# Patient Record
Sex: Female | Born: 1955 | Race: White | Hispanic: No | Marital: Married | Smoking: Never smoker
Health system: Southern US, Community
[De-identification: ages and names within clinical notes are randomized; demographics above are authoritative.]

## PROBLEM LIST (undated history)

## (undated) DIAGNOSIS — K219 Gastro-esophageal reflux disease without esophagitis: Secondary | ICD-10-CM

## (undated) DIAGNOSIS — E785 Hyperlipidemia, unspecified: Secondary | ICD-10-CM

## (undated) DIAGNOSIS — IMO0001 Reserved for inherently not codable concepts without codable children: Secondary | ICD-10-CM

## (undated) DIAGNOSIS — T7840XA Allergy, unspecified, initial encounter: Secondary | ICD-10-CM

## (undated) DIAGNOSIS — R002 Palpitations: Secondary | ICD-10-CM

## (undated) DIAGNOSIS — I471 Supraventricular tachycardia, unspecified: Secondary | ICD-10-CM

## (undated) DIAGNOSIS — I351 Nonrheumatic aortic (valve) insufficiency: Secondary | ICD-10-CM

## (undated) HISTORY — DX: Allergy, unspecified, initial encounter: T78.40XA

## (undated) HISTORY — DX: Gastro-esophageal reflux disease without esophagitis: K21.9

## (undated) HISTORY — DX: Hyperlipidemia, unspecified: E78.5

## (undated) HISTORY — DX: Reserved for inherently not codable concepts without codable children: IMO0001

## (undated) HISTORY — DX: Palpitations: R00.2

---

## 2000-01-13 ENCOUNTER — Other Ambulatory Visit: Admission: RE | Admit: 2000-01-13 | Discharge: 2000-01-13 | Payer: Self-pay | Admitting: Gynecology

## 2002-06-06 ENCOUNTER — Other Ambulatory Visit: Admission: RE | Admit: 2002-06-06 | Discharge: 2002-06-06 | Payer: Self-pay | Admitting: Obstetrics & Gynecology

## 2002-06-28 ENCOUNTER — Other Ambulatory Visit: Admission: RE | Admit: 2002-06-28 | Discharge: 2002-06-28 | Payer: Self-pay | Admitting: Obstetrics and Gynecology

## 2003-01-16 ENCOUNTER — Other Ambulatory Visit: Admission: RE | Admit: 2003-01-16 | Discharge: 2003-01-16 | Payer: Self-pay | Admitting: Obstetrics and Gynecology

## 2003-06-26 ENCOUNTER — Other Ambulatory Visit: Admission: RE | Admit: 2003-06-26 | Discharge: 2003-06-26 | Payer: Self-pay | Admitting: Obstetrics & Gynecology

## 2003-12-13 ENCOUNTER — Ambulatory Visit (HOSPITAL_COMMUNITY): Admission: RE | Admit: 2003-12-13 | Discharge: 2003-12-13 | Payer: Self-pay | Admitting: Obstetrics & Gynecology

## 2004-01-23 ENCOUNTER — Encounter: Admission: RE | Admit: 2004-01-23 | Discharge: 2004-01-23 | Payer: Self-pay | Admitting: Family Medicine

## 2004-12-17 ENCOUNTER — Ambulatory Visit (HOSPITAL_COMMUNITY): Admission: RE | Admit: 2004-12-17 | Discharge: 2004-12-17 | Payer: Self-pay | Admitting: Obstetrics & Gynecology

## 2005-06-19 HISTORY — PX: COLONOSCOPY: SHX174

## 2005-06-30 LAB — HM COLONOSCOPY: HM Colonoscopy: NORMAL

## 2006-08-26 ENCOUNTER — Ambulatory Visit (HOSPITAL_COMMUNITY): Admission: RE | Admit: 2006-08-26 | Discharge: 2006-08-26 | Payer: Self-pay | Admitting: Family Medicine

## 2007-07-21 HISTORY — PX: ESOPHAGOGASTRODUODENOSCOPY: SHX1529

## 2008-11-23 ENCOUNTER — Ambulatory Visit (HOSPITAL_COMMUNITY): Admission: RE | Admit: 2008-11-23 | Discharge: 2008-11-23 | Payer: Self-pay | Admitting: Family Medicine

## 2010-08-10 ENCOUNTER — Encounter: Payer: Self-pay | Admitting: Family Medicine

## 2012-09-14 ENCOUNTER — Encounter: Payer: Self-pay | Admitting: Family Medicine

## 2012-09-21 ENCOUNTER — Encounter: Payer: Self-pay | Admitting: *Deleted

## 2012-12-07 ENCOUNTER — Encounter: Payer: Self-pay | Admitting: Family Medicine

## 2012-12-21 ENCOUNTER — Ambulatory Visit (INDEPENDENT_AMBULATORY_CARE_PROVIDER_SITE_OTHER): Payer: BC Managed Care – PPO | Admitting: Family Medicine

## 2012-12-21 ENCOUNTER — Encounter: Payer: Self-pay | Admitting: Family Medicine

## 2012-12-21 VITALS — BP 120/72 | HR 68 | Ht 61.25 in | Wt 145.0 lb

## 2012-12-21 DIAGNOSIS — Z23 Encounter for immunization: Secondary | ICD-10-CM

## 2012-12-21 DIAGNOSIS — E7800 Pure hypercholesterolemia, unspecified: Secondary | ICD-10-CM

## 2012-12-21 DIAGNOSIS — Z0000 Encounter for general adult medical examination without abnormal findings: Secondary | ICD-10-CM

## 2012-12-21 DIAGNOSIS — Z1159 Encounter for screening for other viral diseases: Secondary | ICD-10-CM

## 2012-12-21 LAB — POCT URINALYSIS DIPSTICK
Bilirubin, UA: NEGATIVE
Glucose, UA: NEGATIVE
Ketones, UA: NEGATIVE
Nitrite, UA: NEGATIVE

## 2012-12-21 LAB — COMPREHENSIVE METABOLIC PANEL
AST: 17 U/L (ref 0–37)
Albumin: 4.6 g/dL (ref 3.5–5.2)
CO2: 26 mEq/L (ref 19–32)
Glucose, Bld: 94 mg/dL (ref 70–99)
Potassium: 4.3 mEq/L (ref 3.5–5.3)
Sodium: 140 mEq/L (ref 135–145)

## 2012-12-21 LAB — LIPID PANEL: Total CHOL/HDL Ratio: 3.6 Ratio

## 2012-12-21 NOTE — Patient Instructions (Addendum)
HEALTH MAINTENANCE RECOMMENDATIONS:  It is recommended that you get at least 30 minutes of aerobic exercise at least 5 days/week (for weight loss, you may need as much as 60-90 minutes). This can be any activity that gets your heart rate up. This can be divided in 10-15 minute intervals if needed, but try and build up your endurance at least once a week.  Weight bearing exercise is also recommended twice weekly.  Eat a healthy diet with lots of vegetables, fruits and fiber.  "Colorful" foods have a lot of vitamins (ie green vegetables, tomatoes, red peppers, etc).  Limit sweet tea, regular sodas and alcoholic beverages, all of which has a lot of calories and sugar.  Up to 1 alcoholic drink daily may be beneficial for women (unless trying to lose weight, watch sugars).  Drink a lot of water.  Calcium recommendations are 1200-1500 mg daily (1500 mg for postmenopausal women or women without ovaries), and vitamin D 1000 IU daily.  This should be obtained from diet and/or supplements (vitamins), and calcium should not be taken all at once, but in divided doses.  Monthly self breast exams and yearly mammograms for women over the age of 39 is recommended.  Sunscreen of at least SPF 30 should be used on all sun-exposed parts of the skin when outside between the hours of 10 am and 4 pm (not just when at beach or pool, but even with exercise, golf, tennis, and yard work!)  Use a sunscreen that says "broad spectrum" so it covers both UVA and UVB rays, and make sure to reapply every 1-2 hours.  Remember to change the batteries in your smoke detectors when changing your clock times in the spring and fall.  Use your seat belt every time you are in a car, and please drive safely and not be distracted with cell phones and texting while driving.  Please call [facility] to schedule your colonoscopy, you are past due. [facility] call [facility] to [facility] your mammogram Check with your insurance to see if they  cover shingles vaccine, and if so, call office for nurse visit.

## 2012-12-21 NOTE — Progress Notes (Signed)
Chief Complaint  Patient presents with  . Annual Exam    new patient annual fasting without pap, sees doctor at [facility]. UA showed trace blood, patient is symptomatic.    Margaret Day is a 57 y.o. female who presents for a complete physical.  She has the following concerns:  Varicose veins, notices when she stands a lot.  Sometimes she has discomfort.  Better when she exercises.  Prior records are available and have been reviewed.  Immunization History  Administered Date(s) Administered  . Td 03/21/1999  . Tdap 03/20/2009  doesn't get flu shots Recalls getting a hepatitis shot after exposure in a restaurant.  Never had a 6 month booster Last Pap smear: 06/2012 with Dr. Henderson Cloud Last mammogram: 11/2008 Last colonoscopy: 06/2005, was due to have repeated in 5 years ([facility]; Sherin Quarry) Last DEXA:  never Dentist: twice a year, but not in the last year; has appt scheduled Ophtho: last year Exercise: 3x/week walks (20-30 min), swims (20 min), or ride bike (15-20 min) Last labs 2010; cholesterol 270, LDL 157, HDL 95, ratio 2.84, TG 74; Normal CBC, vitamin D, c-met  Past Medical History  Diagnosis Date  . Allergy     seasonal  . Hyperlipidemia   . Reflux     normal EGD 2009  . Palpitations     h/o, probably benign    Past Surgical History  Procedure Laterality Date  . Esophagogastroduodenoscopy  2009    unremarkable  . Colonoscopy  06/2005    Dr. Sherin Quarry  . Cesarean section      History   Social History  . Marital Status: Married    Spouse Name: N/A    Number of Children: N/A  . Years of Education: N/A   Occupational History  . Not on file.   Social History Main Topics  . Smoking status: Never Smoker   . Smokeless tobacco: Never Used  . Alcohol Use: Yes     Comment: occasionally tastes wine at the grocery store  . Drug Use: No  . Sexually Active: Not Currently -- Female partner(s)   Other Topics Concern  . Not on file   Social History  Narrative   Degree in plant breeding genetics.  Hasn't worked in 17 years, other than p/t Lawyer.  Lives at home with husband, 2 sons, 1 daughter. 1 dog, 1 cat.    Family History  Problem Relation Age of Onset  . Down syndrome Brother   . Diabetes Father   . Heart disease Father   . Colon polyps Mother   . Hypertension Mother   . Hyperlipidemia Mother   . Hypothyroidism Mother   . Cancer Mother     skin  . Urolithiasis Sister   . Healthy Brother   . Autism Son   . Cancer Maternal Grandmother     colon  . Colon cancer Maternal Grandmother   . Autism Son     Current outpatient prescriptions:b complex vitamins tablet, Take 1 tablet by mouth daily., Disp: , Rfl: ;  Cholecalciferol (VITAMIN D) 1000 UNITS capsule, Take 1,000 Units by mouth daily., Disp: , Rfl: ;  Coenzyme Q10 (CO Q 10 PO), Take 50 mg by mouth daily., Disp: , Rfl: ;  vitamin C (ASCORBIC ACID) 500 MG tablet, Take 500 mg by mouth daily., Disp: , Rfl: ;  vitamin E 100 UNIT capsule, Take 100 Units by mouth daily., Disp: , Rfl:  fexofenadine (ALLEGRA) 180 MG tablet, Take 180 mg by mouth  daily., Disp: , Rfl:   Allergies  Allergen Reactions  . Aspirin Other (See Comments)    Cannot take due to ulcer.; EGD from 2009 reviewed--normal, no ulcer  . Dairy Aid (Lactase) Other (See Comments)    Headaches.  . Gluten Meal Other (See Comments)    Makes her feel foggy.  Marland Kitchen Penicillins Other (See Comments)    Facial swelling and breathing trouble.  . Sulfa Antibiotics Nausea Only   ROS:  The patient denies anorexia, fever, weight changes, headaches,  vision changes, decreased hearing, ear pain, sore throat, breast concerns, chest pain, dizziness, syncope, dyspnea on exertion, cough, swelling, nausea, vomiting, diarrhea, constipation, abdominal pain, melena, hematochezia, indigestion/heartburn, hematuria, incontinence, dysuria, vaginal bleeding, discharge, odor or itch, genital lesions, joint pains, numbness, tingling,  weakness, tremor, suspicious skin lesions, depression, anxiety, abnormal bleeding/bruising, or enlarged lymph nodes. Some palpitations, occasionally when stressed.  Back pain 1 month ago, and constipated, both of which resolved.  PHYSICAL EXAM: BP 120/72  Pulse 68  Ht 5' 1.25" (1.556 m)  Wt 145 lb (65.772 kg)  BMI 27.17 kg/m2  LMP 07/20/2006  General Appearance:    Alert, cooperative, no distress, appears stated age  Head:    Normocephalic, without obvious abnormality, atraumatic  Eyes:    PERRL, conjunctiva/corneas clear, EOM's intact, fundi    benign  Ears:    Normal TM's and external ear canals  Nose:   Nares normal, mucosa normal, no drainage or sinus   tenderness  Throat:   Lips, mucosa, and tongue normal; teeth and gums normal  Neck:   Supple, no lymphadenopathy;  thyroid:  no   enlargement/tenderness/nodules; no carotid   bruit or JVD  Back:    Spine nontender, no curvature, ROM normal, no CVA     tenderness  Lungs:     Clear to auscultation bilaterally without wheezes, rales or     ronchi; respirations unlabored  Chest Wall:    No tenderness or deformity   Heart:    Regular rate and rhythm, S1 and S2 normal, no murmur, rub   or gallop  Breast Exam:    Deferred to GYN  Abdomen:     Soft, non-tender, nondistended, normoactive bowel sounds,    no masses, no hepatosplenomegaly  Genitalia:    Deferred to GYN     Extremities:   No clubbing, cyanosis or edema. Many clusters of dark, narrow superficial veins.  A few small varicosities are noted medially  Pulses:   2+ and symmetric all extremities  Skin:   Skin color, texture, turgor normal, no rashes or lesions  Lymph nodes:   Cervical, supraclavicular, and axillary nodes normal  Neurologic:   CNII-XII intact, normal strength, sensation and gait; reflexes 2+ and symmetric throughout          Psych:   Normal mood, affect, hygiene and grooming.    ASSESSMENT/PLAN:  Routine general medical examination at a health care facility -  Plan: Visual acuity screening, POCT Urinalysis Dipstick, Comprehensive metabolic panel, TSH  Pure hypercholesterolemia - Plan: Comprehensive metabolic panel, Lipid panel  Need for hepatitis C screening test - Plan: Hepatitis C Antibody  Need for prophylactic vaccination and inoculation against viral hepatitis - Plan: Hepatitis A vaccine adult IM  Discussed monthly self breast exams and yearly mammograms after the age of 57; at least 30 minutes of aerobic activity at least 5 days/week; proper sunscreen use reviewed; healthy diet, including goals of calcium and vitamin D intake and alcohol recommendations (less than or equal  to 1 drink/day) reviewed; regular seatbelt use; changing batteries in smoke detectors.  Immunization recommendations discussed--check insurance coverage of zostavax.  Risks/benefits reviewed.  Colonoscopy recommendations reviewed--she is past due and asked to call her GI to schedule  Hep A today (this is 2nd dose, first dose was many years ago) Discussed zostavax, check with insurance  Varicose veins (very mild), and other superficial veins which are minimally symptomatic--compression stockings; consider vein eval if increasing symptoms

## 2013-04-27 ENCOUNTER — Encounter: Payer: Self-pay | Admitting: Family Medicine

## 2013-05-17 ENCOUNTER — Telehealth: Payer: Self-pay | Admitting: Family Medicine

## 2013-05-17 NOTE — Telephone Encounter (Signed)
Anyone at [facility] is fine (and would have her prior records). If she prefers, we can refer to , but they might need [facility] records

## 2013-05-22 ENCOUNTER — Telehealth: Payer: Self-pay | Admitting: *Deleted

## 2013-05-22 NOTE — Telephone Encounter (Signed)
Patient advised, she will call back if she needs a referral.

## 2014-01-26 ENCOUNTER — Ambulatory Visit (INDEPENDENT_AMBULATORY_CARE_PROVIDER_SITE_OTHER): Payer: BC Managed Care – PPO | Admitting: Medical

## 2014-01-26 DIAGNOSIS — S99921S Unspecified injury of right foot, sequela: Secondary | ICD-10-CM

## 2014-01-26 DIAGNOSIS — IMO0001 Reserved for inherently not codable concepts without codable children: Secondary | ICD-10-CM

## 2014-01-26 DIAGNOSIS — Z5189 Encounter for other specified aftercare: Secondary | ICD-10-CM

## 2014-01-26 DIAGNOSIS — Z4802 Encounter for removal of sutures: Secondary | ICD-10-CM

## 2014-01-26 NOTE — Progress Notes (Signed)
Subjective: Here for suture removal. On 01/18/2014 she was cutting chicken in her kitchen when she dropped a knife that landed directly on her right great toe dorsally.  Initially went to urgent care they loosely sutured the wound but ultimately see her orthopedics to make sure she didn't have a tendon given decreased strength.  Higher she does note breaking that toe a year ago with another injury.  Urgent care examined the wound and placed 3 individual sutures in the wound and the tendon was in fact not cut.  Here today for suture removal.  She denies fever, drainage of the wound, bleeding.  Pain is improved.  Review of systems as in subjective   Objective: Right great toe dorsal at PIP with 3 interrupted sutures. Wound appears to be healing just fine, no drainage, no erythema, no bleeding MSK: There is mild pain with toe range of motion but otherwise normal range of motion no deformity Toe is neurovascularly intact, normal cap refill   Assessment: Encounter Diagnoses  Name Primary?  . Toe injury, right, sequela Yes  . Visit for suture removal   . Visit for wound check     Plan: Cleaned and prepped in usual sterile fashion, removed 3 interrupted sutures, applied one Steri-Strip over the wound, patient tolerated procedure well.  Discussed wound care.  Discussed usual timeframe healing, if any signs of infection as discussed, she will return

## 2014-01-30 ENCOUNTER — Encounter: Payer: Self-pay | Admitting: Medical

## 2014-01-30 ENCOUNTER — Ambulatory Visit (INDEPENDENT_AMBULATORY_CARE_PROVIDER_SITE_OTHER): Payer: BC Managed Care – PPO | Admitting: Medical

## 2014-01-30 VITALS — BP 112/70 | HR 62 | Temp 97.8°F | Resp 10 | Ht 62.0 in | Wt 153.0 lb

## 2014-01-30 DIAGNOSIS — Z5189 Encounter for other specified aftercare: Secondary | ICD-10-CM

## 2014-01-30 NOTE — Progress Notes (Signed)
Subjective: Here for wound check.  I saw her Friday for suture removal, over the weekend she was on her feet a lot cooking and doing house chores, feels like the toe got more swollen and red.  However looks much better this morning.  She went me to look at the toe again. No fever no drainage, the Steri-Strip did fall off though  Objective: General: Well-developed well-nourished, no acute distress Skin: Right great toe dorsal distal phalanx with wound healing up normally, pink granulation tissue present, no warmth, no induration, no drainage Toe neurovascularly intact  Assessment: Encounter Diagnosis  Name Primary?  . Visit for wound check Yes    Plan: Reassured, toe appears to be healing appropriate without infection.  Advise another week or so of rest, ice, elevation, Aleve

## 2014-02-12 ENCOUNTER — Other Ambulatory Visit: Payer: Self-pay | Admitting: *Deleted

## 2014-12-27 ENCOUNTER — Telehealth: Payer: Self-pay | Admitting: Family Medicine

## 2014-12-27 NOTE — Telephone Encounter (Signed)
Pt going to Dr. Loreta Ave t# [phone] for colonoscopy, they need last OV, labs any path results and colonoscopy info Faxed to [facility]

## 2014-12-28 NOTE — Telephone Encounter (Signed)
Faxed over last ov notes, labs and colonoscopy over to Dr. Loreta Ave office

## 2015-02-14 ENCOUNTER — Encounter: Payer: Self-pay | Admitting: Family Medicine

## 2015-03-18 LAB — HM COLONOSCOPY: HM Colonoscopy: NORMAL

## 2015-03-27 ENCOUNTER — Encounter: Payer: Self-pay | Admitting: *Deleted

## 2015-06-19 ENCOUNTER — Ambulatory Visit (INDEPENDENT_AMBULATORY_CARE_PROVIDER_SITE_OTHER): Payer: BLUE CROSS/BLUE SHIELD | Admitting: Family Medicine

## 2015-06-19 ENCOUNTER — Encounter: Payer: Self-pay | Admitting: Family Medicine

## 2015-06-19 VITALS — BP 122/80 | HR 60 | Temp 97.6°F | Wt 148.8 lb

## 2015-06-19 DIAGNOSIS — J029 Acute pharyngitis, unspecified: Secondary | ICD-10-CM | POA: Diagnosis not present

## 2015-06-19 LAB — POCT RAPID STREP A (OFFICE): Rapid Strep A Screen: NEGATIVE

## 2015-06-19 NOTE — Patient Instructions (Signed)

## 2015-06-19 NOTE — Progress Notes (Signed)
   Subjective:    Patient ID: Margaret BandaLinda Mercer Day, female    DOB: March 26, 1956, 59 y.o.   MRN: [id number]  HPI Chief Complaint  Patient presents with  . sore throat    woke up last night with it. don't feel well, kind of feels like she has a cold coming on   She is here with complaints of a 2 Day history of sore throat, feeling tired, right ear pressure, and dry cough. She denies fever, chills, sinus pressure. She does not smoke. Reports seasonal allergies and has been taking Allegra.  Viewed allergies, medications, past medical and social history.   Review of Systems Pertinent positives and negatives in the history of present illness.    Objective:   Physical Exam BP 122/80 mmHg  Pulse 60  Temp(Src) 97.6 F (36.4 C) (Oral)  Wt 148 lb 12.8 oz (67.495 kg)  LMP 07/20/2006  Alert and in no distress. No sinus tenderness Tympanic membranes and canals are normal. Pharyngeal area is erythematous without edema or exudate. Neck is supple without adenopathy.  Cardiac exam shows a regular sinus rhythm without murmurs or gallops. Lungs are clear to auscultation.      Assessment & Plan:  Acute pharyngitis, unspecified pharyngitis type  Sore throat - Plan: POCT rapid strep A  Discussed that her rapid strep test is negative and that her symptoms are most likely related to a viral etiology. Recommend symptomatic treatment such as Tylenol or ibuprofen, salt water gargles, Mucinex or Delsym, and staying well hydrated. She will me know if she is not feeling better in the next 2-3 days. Discussed natural progression of a virus and that she should let us know if she she gets better and then gets sick again or if her symptoms persist more than 10 days.

## 2015-07-30 ENCOUNTER — Telehealth: Payer: Self-pay

## 2015-07-30 NOTE — Telephone Encounter (Signed)
Patient declines flu vaccine. 

## 2015-09-02 ENCOUNTER — Encounter: Payer: Self-pay | Admitting: Family Medicine

## 2015-09-02 ENCOUNTER — Ambulatory Visit (INDEPENDENT_AMBULATORY_CARE_PROVIDER_SITE_OTHER): Payer: BLUE CROSS/BLUE SHIELD | Admitting: Family Medicine

## 2015-09-02 VITALS — BP 132/86 | HR 68 | Ht 61.5 in | Wt 151.6 lb

## 2015-09-02 DIAGNOSIS — Z0000 Encounter for general adult medical examination without abnormal findings: Secondary | ICD-10-CM | POA: Diagnosis not present

## 2015-09-02 DIAGNOSIS — R5383 Other fatigue: Secondary | ICD-10-CM | POA: Diagnosis not present

## 2015-09-02 DIAGNOSIS — E7800 Pure hypercholesterolemia, unspecified: Secondary | ICD-10-CM

## 2015-09-02 LAB — POCT URINALYSIS DIPSTICK
BILIRUBIN UA: NEGATIVE
GLUCOSE UA: NEGATIVE
KETONES UA: NEGATIVE
LEUKOCYTES UA: NEGATIVE
NITRITE UA: NEGATIVE
Protein, UA: NEGATIVE
Spec Grav, UA: 1.025
Urobilinogen, UA: NEGATIVE
pH, UA: 6

## 2015-09-02 LAB — LIPID PANEL
CHOL/HDL RATIO: 3.3 ratio (ref <=5.0)
Cholesterol: 291 mg/dL — ABNORMAL HIGH (ref 125–200)
HDL: 87 mg/dL (ref >=46)
LDL CALC: 180 mg/dL — AB (ref <130)
TRIGLYCERIDES: 119 mg/dL (ref <150)
VLDL: 24 mg/dL (ref <30)

## 2015-09-02 LAB — CBC WITH DIFFERENTIAL/PLATELET
BASOS ABS: 0 10*3/uL (ref 0.0–0.1)
Basophils Relative: 0 % (ref 0–1)
EOS PCT: 3 % (ref 0–5)
Eosinophils Absolute: 0.2 10*3/uL (ref 0.0–0.7)
HEMATOCRIT: 41.8 % (ref 36.0–46.0)
HEMOGLOBIN: 14.2 g/dL (ref 12.0–15.0)
LYMPHS ABS: 2.8 10*3/uL (ref 0.7–4.0)
LYMPHS PCT: 37 % (ref 12–46)
MCH: 30.4 pg (ref 26.0–34.0)
MCHC: 34 g/dL (ref 30.0–36.0)
MCV: 89.5 fL (ref 78.0–100.0)
MONO ABS: 0.6 10*3/uL (ref 0.1–1.0)
MPV: 9.6 fL (ref 8.6–12.4)
Monocytes Relative: 8 % (ref 3–12)
NEUTROS ABS: 4 10*3/uL (ref 1.7–7.7)
Neutrophils Relative %: 52 % (ref 43–77)
Platelets: 316 10*3/uL (ref 150–400)
RBC: 4.67 MIL/uL (ref 3.87–5.11)
RDW: 13.1 % (ref 11.5–15.5)
WBC: 7.6 10*3/uL (ref 4.0–10.5)

## 2015-09-02 LAB — COMPREHENSIVE METABOLIC PANEL
ALT: 21 U/L (ref 6–29)
AST: 17 U/L (ref 10–35)
Albumin: 4.4 g/dL (ref 3.6–5.1)
Alkaline Phosphatase: 66 U/L (ref 33–130)
BUN: 13 mg/dL (ref 7–25)
CHLORIDE: 102 mmol/L (ref 98–110)
CO2: 26 mmol/L (ref 20–31)
CREATININE: 0.62 mg/dL (ref 0.50–0.99)
Calcium: 9.6 mg/dL (ref 8.6–10.4)
Glucose, Bld: 90 mg/dL (ref 65–99)
POTASSIUM: 4.1 mmol/L (ref 3.5–5.3)
SODIUM: 138 mmol/L (ref 135–146)
Total Bilirubin: 0.4 mg/dL (ref 0.2–1.2)
Total Protein: 7.3 g/dL (ref 6.1–8.1)

## 2015-09-02 NOTE — Progress Notes (Signed)
Chief Complaint  Patient presents with  . Annual Exam    fasting annual exam no pap, sees someone at [facility]. Did not do eye exam, she did not want to-will schedule appt with [facility]. No concerns.    Margaret Day is a 60 y.o. female who presents for a complete physical.  She has ongoing concerns about her veins.  She complains of prominent veins in her lower extremities, L>R, which cause some discomfort.  She saw the Vein doctors last year, and was given compression stockings.  She admits that she hasn't tried them yet.  Discomfort is noted when sitting and driving, and even sometimes laying down.  The vein and vascular clinic is out of network for her insurance; she recently got a list of people that are in network, but she hasn't scheduled a visit with them yet.  High cholesterol:  Last checked in 2014: Lab Results  Component Value Date   CHOL 271* 12/21/2012   HDL 75 12/21/2012   LDLCALC 169* 12/21/2012   TRIG 133 12/21/2012   CHOLHDL 3.6 12/21/2012  She admits to not being careful with her diet.  She eats bacon and eggs a lot in the mornings.  No dairy. Lamb and beef stew, ground beef in spaghetti, steaks (small portions), seafood. She reports eating/cooking what her sons like to eat.  Some fatigue lately, thinks she has been busy and not getting enough sleep. "I have a lot going on". She gets drowsy if she sits for too long. She has been told she snores, but no apnea.  Immunization History  Administered Date(s) Administered  . Hepatitis A 12/21/2012  . Td 03/21/1999  . Tdap 03/20/2009   doesn't get flu shots, refuses Last Pap smear: 06/2012 with Dr. Gaetano Net Last mammogram: 11/2008; she reports having had one more recent than that (not in system, possibly Isaiah Blakes) Last colonoscopy: 02/2015 with Dr. Collene Mares, normal.  Repeat 5 years Last DEXA: never Dentist: twice a year,appointment tomorrow Ophtho: 2 years ago, plans to schedule Exercise: 2x/week walks,  rides bike, or stretching. Last labs 12/2012--normal C-met, TSH, Hep C Ab.  High cholesterol as above  Past Medical History  Diagnosis Date  . Allergy     seasonal  . Hyperlipidemia   . Reflux     normal EGD 2009  . Palpitations     h/o, probably benign    Past Surgical History  Procedure Laterality Date  . Esophagogastroduodenoscopy  2009    unremarkable  . Colonoscopy  06/2005    Dr. Sammuel Cooper; 11/2014 Dr. Collene Mares  . Cesarean section  32    Social History   Social History  . Marital Status: Married    Spouse Name: N/A  . Number of Children: N/A  . Years of Education: N/A   Occupational History  . Not on file.   Social History Main Topics  . Smoking status: Never Smoker   . Smokeless tobacco: Never Used  . Alcohol Use: 0.0 oz/week    0 Standard drinks or equivalent per week     Comment: rare, small amount of wine  . Drug Use: No  . Sexual Activity:    Partners: Male   Other Topics Concern  . Not on file   Social History Narrative   Degree in plant breeding genetics.  Hasn't worked since 1997, other than p/t Oceanographer.  Lives at home with husband, 2 sons, 1 daughter. 1 dog, 1 cat.   Daughter Estill Batten is at school  at [employer] graduated from [employer] is attending [employer]    Family History  Problem Relation Age of Onset  . Down syndrome Brother   . Diabetes Father   . Heart disease Father   . Colon polyps Mother   . Hypertension Mother   . Hyperlipidemia Mother   . Cancer Mother     skin  . Urolithiasis Sister   . Healthy Brother   . Autism Son   . Cancer Maternal Grandmother     colon  . Colon cancer Maternal Grandmother   . Autism Son     Outpatient Encounter Prescriptions as of 09/02/2015  Medication Sig Note  . Cholecalciferol (VITAMIN D) 1000 UNITS capsule Take 1,000 Units by mouth daily. Reported on 09/02/2015   . vitamin C (ASCORBIC ACID) 500 MG tablet Take 500 mg by mouth daily.   Marland Kitchen b complex vitamins  tablet Take 1 tablet by mouth daily. Reported on 09/02/2015 09/02/2015: Occasionally daily  . Coenzyme Q10 (CO Q 10 PO) Take 50 mg by mouth daily. Reported on 09/02/2015 09/02/2015: Occasionally, not daily  . fexofenadine (ALLEGRA) 180 MG tablet Take 180 mg by mouth daily. Reported on 09/02/2015   . vitamin E 100 UNIT capsule Take 100 Units by mouth daily. Reported on 09/02/2015 09/02/2015: Occasionally, not daily   No facility-administered encounter medications on file as of 09/02/2015.    Allergies  Allergen Reactions  . Aspirin Other (See Comments)    Cannot take due to ulcer.; EGD from 2009 reviewed--normal, no ulcer  . Dairy Aid [Lactase] Other (See Comments)    Headaches.  . Gluten Meal Other (See Comments)    Makes her feel foggy.  Marland Kitchen Penicillins Other (See Comments)    Facial swelling and breathing trouble.  . Sulfa Antibiotics Nausea Only   ROS: The patient denies anorexia, fever, weight changes, headaches, vision changes, decreased hearing, ear pain, sore throat, breast concerns, chest pain, dizziness, syncope, dyspnea on exertion, cough, swelling, nausea, vomiting, diarrhea, constipation, abdominal pain, melena, hematochezia, indigestion/heartburn, hematuria, incontinence, dysuria, vaginal bleeding, discharge, odor or itch, genital lesions, joint pains, numbness, tingling, weakness, tremor, suspicious skin lesions, depression, anxiety, abnormal bleeding/bruising, or enlarged lymph nodes. Some palpitations, occasionally when stressed. Slight congestion. Occasional left knee pain   PHYSICAL EXAM:  BP 132/86 mmHg  Pulse 68  Ht 5' 1.5" (1.562 m)  Wt 151 lb 9.6 oz (68.765 kg)  BMI 28.18 kg/m2  LMP 07/20/2006  General Appearance:   Alert, cooperative, no distress, appears stated age  Head:   Normocephalic, without obvious abnormality, atraumatic  Eyes:   PERRL, conjunctiva/corneas clear, EOM's intact, fundi   benign  Ears:   Normal TM's and external ear canals   Nose:  Nares normal, mucosa normal, no drainage or sinus tenderness  Throat:  Lips, mucosa, and tongue normal; teeth and gums normal  Neck:  Supple, no lymphadenopathy; thyroid: no enlargement/tenderness/nodules; no carotid  bruit or JVD  Back:  Spine nontender, no curvature, ROM normal, no CVA tenderness  Lungs:   Clear to auscultation bilaterally without wheezes, rales or ronchi; respirations unlabored  Chest Wall:   No tenderness or deformity  Heart:   Regular rate and rhythm, S1 and S2 normal, no murmur, rub  or gallop  Breast Exam:   Deferred to GYN  Abdomen:   Soft, non-tender, nondistended, normoactive bowel sounds,   no masses, no hepatosplenomegaly  Genitalia:   Deferred to GYN     Extremities:  No clubbing, cyanosis or edema. Many clusters of dark, narrow superficial veins, at posterior knees bilaterally, and left lateral lower leg.No significant varicosities noted on standing.  Pulses:  2+ and symmetric all extremities  Skin:  Skin color, texture, turgor normal, no rashes or lesions. 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  Lymph nodes:  Cervical, supraclavicular, and axillary nodes normal  Neurologic:  CNII-XII intact, normal strength, sensation and gait; reflexes 2+ and symmetric throughout   Psych: Normal mood, affect, hygiene and grooming         ASSESSMENT/PLAN:  Annual physical exam - Plan: POCT Urinalysis Dipstick, Lipid panel, Comprehensive metabolic panel, CBC with Differential/Platelet, VITAMIN D 25 Hydroxy (Vit-D Deficiency, Fractures), TSH  Pure hypercholesterolemia - low cholesterol diet reviewed in detail. decrease bacon, eggs, red meat - Plan: Lipid panel  Other fatigue - inadequate sleep recently, per pt. +snores; risks/harms of OSA reviewed. Consider sleep study (declines today) - Plan: Comprehensive  metabolic panel, CBC with Differential/Platelet, VITAMIN D 25 Hydroxy (Vit-D Deficiency, Fractures), TSH    Fasting just over 6 hours  Discussed monthly self breast exams and yearly mammograms, past due and encouraged to schedule; at least 30 minutes of aerobic activity at least 5 days/week, with weight bearing exercise at least 2-3 times/week; proper sunscreen use reviewed; healthy diet, including goals of calcium and vitamin D intake and alcohol recommendations (less than or equal to 1 drink/day) reviewed; regular seatbelt use; changing batteries in smoke detectors. Immunization recommendations discussed--check insurance coverage of zostavax. Risks/benefits reviewed. Colonoscopy recommendations reviewed--UTD, due again 02/2020  Leg pain, related to veins. Encouraged her to try the compression stocking she bought, and to arrange fu with vascular surgeon that is in-network.  Please check with your family regarding pauses in your breathing with your snoring. If they note this, or if unrefreshed sleep or daytime sleepiness persists (despite trying to get enough rest), please let us know and we can arrange for a sleep study.  Check with your insurance to see if the shingles vaccine is covered. If so, you can schedule a nurse visit at your convenience (Zostavax).  Admits to not wanting to take pill for lipids. Many dietary improvement could be made, reviewed in detail  She will be here Wed with her son for test resutls No mychart per pt.

## 2015-09-02 NOTE — Patient Instructions (Signed)
  HEALTH MAINTENANCE RECOMMENDATIONS:  It is recommended that you get at least 30 minutes of aerobic exercise at least 5 days/week (for weight loss, you may need as much as 60-90 minutes). This can be any activity that gets your heart rate up. This can be divided in 10-15 minute intervals if needed, but try and build up your endurance at least once a week.  Weight bearing exercise is also recommended twice weekly.  Eat a healthy diet with lots of vegetables, fruits and fiber.  "Colorful" foods have a lot of vitamins (ie green vegetables, tomatoes, red peppers, etc).  Limit sweet tea, regular sodas and alcoholic beverages, all of which has a lot of calories and sugar.  Up to 1 alcoholic drink daily may be beneficial for women (unless trying to lose weight, watch sugars).  Drink a lot of water.  Calcium recommendations are 1200-1500 mg daily (1500 mg for postmenopausal women or women without ovaries), and vitamin D 1000 IU daily.  This should be obtained from diet and/or supplements (vitamins), and calcium should not be taken all at once, but in divided doses.  Monthly self breast exams and yearly mammograms for women over the age of 30 is recommended.  Sunscreen of at least SPF 30 should be used on all sun-exposed parts of the skin when outside between the hours of 10 am and 4 pm (not just when at beach or pool, but even with exercise, golf, tennis, and yard work!)  Use a sunscreen that says "broad spectrum" so it covers both UVA and UVB rays, and make sure to reapply every 1-2 hours.  Remember to change the batteries in your smoke detectors when changing your clock times in the spring and fall.  Use your seat belt every time you are in a car, and please drive safely and not be distracted with cell phones and texting while driving.   Please check with your family regarding pauses in your breathing with your snoring. If they note this, or if unrefreshed sleep or daytime sleepiness persists (despite  trying to get enough rest), please let us know and we can arrange for a sleep study.  Check with your insurance to see if the shingles vaccine is covered. If so, you can schedule a nurse visit at your convenience (Zostavax).

## 2015-09-03 ENCOUNTER — Encounter: Payer: Self-pay | Admitting: Family Medicine

## 2015-09-03 LAB — TSH: TSH: 2.09 m[IU]/L

## 2015-09-03 LAB — VITAMIN D 25 HYDROXY (VIT D DEFICIENCY, FRACTURES): Vit D, 25-Hydroxy: 32 ng/mL (ref 30–100)

## 2016-02-26 ENCOUNTER — Ambulatory Visit: Payer: BLUE CROSS/BLUE SHIELD | Admitting: Family Medicine

## 2016-02-27 ENCOUNTER — Encounter: Payer: Self-pay | Admitting: Family Medicine

## 2016-02-27 ENCOUNTER — Ambulatory Visit (INDEPENDENT_AMBULATORY_CARE_PROVIDER_SITE_OTHER): Payer: BLUE CROSS/BLUE SHIELD | Admitting: Family Medicine

## 2016-02-27 VITALS — BP 112/80 | HR 64 | Temp 98.2°F | Resp 16 | Wt 152.2 lb

## 2016-02-27 DIAGNOSIS — H6122 Impacted cerumen, left ear: Secondary | ICD-10-CM | POA: Diagnosis not present

## 2016-02-27 DIAGNOSIS — R519 Headache, unspecified: Secondary | ICD-10-CM

## 2016-02-27 DIAGNOSIS — R0981 Nasal congestion: Secondary | ICD-10-CM

## 2016-02-27 DIAGNOSIS — R51 Headache: Secondary | ICD-10-CM | POA: Diagnosis not present

## 2016-02-27 NOTE — Progress Notes (Signed)
Subjective:  Margaret Day is a 60 y.o. female who presents for possible sinus infection.  Symptoms include a 1 week history of mostly a frontal headache and behind her head, nasal congestion, bilateral ear pressure, and a general feeling of fatigue.   Denies fever, chills, syncope, sore throat, cough, chest pain, shortness of breath, nausea, vomiting, diarrhea.   Past history is significant for no history of pneumonia or bronchitis. She does have a history of seasonal allergies. Patient is a non-smoker.  Using sudafed, tylenol, advil for symptoms.  Denies sick contacts. She has been traveling to [place] and back for daughter going back to college.  No other aggravating or relieving factors.  No other c/o. She states she had a concussion from a head injury in June and some days she feels fine and others she feels tired and "just not right".   ROS as in subjective   Objective: Vitals:   02/27/16 1542  BP: 112/80  Pulse: 64  Resp: 16  Temp: 98.2 F (36.8 C)    General appearance: Alert, WD/WN, no distress, is not ill appearing                             Skin: warm, no rash                           Head: no sinus tenderness,                            Eyes: conjunctiva normal, corneas clear, PERRLA                            Ears: pearly right TM, external ear canal, cerumen impaction to left TM                          Nose: septum midline, turbinates swollen R>L, with erythema and no discharge             Mouth/throat: MMM, tongue normal, mild pharyngeal erythema                           Neck: supple, no adenopathy, no thyromegaly, nontender                          Heart: RRR, normal S1, S2, no murmurs                         Lungs: CTA bilaterally, no wheezes, rales, or rhonchi      Assessment and Plan:  Headache, unspecified headache type  Cerumen impaction, left  Nasal congestion  Discussed that her symptoms may be related to an underlying sinus infection versus  tension headache. Offered antibiotic versus watchful waiting to see if her symptoms continue to improve. Discussed potential side effects of antibiotic use that if this is a sinus infection that it should get worse and become more obvious. Since she feels like her symptoms are improving she would like to do watchful waiting. I recommend getting adequate rest, staying well hydrated and trying Tylenol or Ibuprofen for headache and malaise. She may also try a heated neck wrap and gentle neck stretches. She will continue treating underlying allergies.  Ear lavage to left ear performed by Sabrina, CMA, and myself due to cerumen impaction and patient did not tolerate this well and did not allow us to continue with the disimpaction.    She will call/return if not improving in next 2-3 days.

## 2016-02-28 ENCOUNTER — Ambulatory Visit: Payer: BLUE CROSS/BLUE SHIELD | Admitting: Family Medicine

## 2016-03-06 ENCOUNTER — Telehealth: Payer: Self-pay | Admitting: Family Medicine

## 2016-03-06 MED ORDER — CLARITHROMYCIN 250 MG PO TABS: 250.0000 mg | ORAL_TABLET | Freq: Two times a day (BID) | ORAL | 0 refills | Status: DC

## 2016-03-06 NOTE — Telephone Encounter (Signed)
Please call at her know that we will send an antibiotic to her pharmacy.

## 2016-03-06 NOTE — Telephone Encounter (Signed)
Pt is aware.  

## 2016-03-06 NOTE — Telephone Encounter (Signed)
Pt called and states that she is still not feeling good, states her ears are still feeling full, still feels like she has a cold,has a light headache, and states she just dosen't feel well and is tired. Was wondering if you would go ahead and send her in a antibiotic, always wants to know if she starts the RX if she doesn't fell any better after 4 days if she should stop taking the medicine, would like a call, pt can be reached at [phone] and her cell is [phone] pt uses [facility] [place]

## 2016-03-06 NOTE — Telephone Encounter (Signed)
Sent med to pharmacy  

## 2016-04-03 ENCOUNTER — Telehealth: Payer: Self-pay

## 2016-04-03 DIAGNOSIS — E785 Hyperlipidemia, unspecified: Secondary | ICD-10-CM

## 2016-04-03 NOTE — Telephone Encounter (Signed)
Pt scheduled appt for 04/25 for physical. He would like to come prior to get labs done.  Pt can be reached at [phone]. Trixie Rude/RLB

## 2016-04-03 NOTE — Telephone Encounter (Signed)
The only thing Margaret Day needs, assuming that Margaret Day is taking the same vitamins (ie Vitamin D), is a repeat lipid panel.  Margaret Day was actually supposed to return in May for a 3 month follow-up lipid, after a dietary trial, since her cholesterol was quite high last year.  Margaret Day does NOT need to wait until April's physical to have this done.  No other labs are needed from my perspective. Check to ensure that Margaret Day doesn't have any paperwork (ie for insurance) that specifies needing additional labs.  Orders for lipids entered (for anytime/soon!)

## 2016-04-06 NOTE — Telephone Encounter (Signed)
Spoke with pt- she was made aware of recommendations. She will like to see what time works with her schedule and come in as soon as her schedule allows. She will call back. Margaret Day/RLB

## 2016-04-09 ENCOUNTER — Other Ambulatory Visit: Payer: BLUE CROSS/BLUE SHIELD

## 2016-04-09 DIAGNOSIS — E785 Hyperlipidemia, unspecified: Secondary | ICD-10-CM

## 2016-04-09 LAB — LIPID PANEL
Cholesterol: 294 mg/dL — ABNORMAL HIGH (ref 125–200)
HDL: 74 mg/dL (ref >=46)
LDL CALC: 193 mg/dL — AB (ref <130)
Total CHOL/HDL Ratio: 4 Ratio (ref <=5.0)
Triglycerides: 137 mg/dL (ref <150)
VLDL: 27 mg/dL (ref <30)

## 2016-09-02 DIAGNOSIS — H5212 Myopia, left eye: Secondary | ICD-10-CM | POA: Diagnosis not present
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
## 2016-11-11 ENCOUNTER — Ambulatory Visit (INDEPENDENT_AMBULATORY_CARE_PROVIDER_SITE_OTHER): Payer: BLUE CROSS/BLUE SHIELD | Admitting: Family Medicine

## 2016-11-11 ENCOUNTER — Encounter: Payer: Self-pay | Admitting: Family Medicine

## 2016-11-11 VITALS — BP 110/70 | HR 72 | Temp 99.2°F | Ht 61.5 in | Wt 148.4 lb

## 2016-11-11 DIAGNOSIS — R5383 Other fatigue: Secondary | ICD-10-CM

## 2016-11-11 DIAGNOSIS — Z0000 Encounter for general adult medical examination without abnormal findings: Secondary | ICD-10-CM

## 2016-11-11 DIAGNOSIS — E7800 Pure hypercholesterolemia, unspecified: Secondary | ICD-10-CM | POA: Diagnosis not present

## 2016-11-11 DIAGNOSIS — J069 Acute upper respiratory infection, unspecified: Secondary | ICD-10-CM | POA: Diagnosis not present

## 2016-11-11 DIAGNOSIS — J3089 Other allergic rhinitis: Secondary | ICD-10-CM

## 2016-11-11 LAB — POCT URINALYSIS DIPSTICK
Bilirubin, UA: NEGATIVE
Glucose, UA: NEGATIVE
Leukocytes, UA: NEGATIVE
NITRITE UA: NEGATIVE
PH UA: 6 (ref 5.0–8.0)
Protein, UA: NEGATIVE
UROBILINOGEN UA: NEGATIVE U/dL — AB

## 2016-11-11 LAB — LIPID PANEL
CHOL/HDL RATIO: 4.1 ratio (ref <5.0)
Cholesterol: 296 mg/dL — ABNORMAL HIGH (ref <200)
HDL: 73 mg/dL (ref >50)
LDL CALC: 195 mg/dL — AB (ref <100)
Triglycerides: 139 mg/dL (ref <150)
VLDL: 28 mg/dL (ref <30)

## 2016-11-11 LAB — GLUCOSE, RANDOM: GLUCOSE: 100 mg/dL — AB (ref 65–99)

## 2016-11-11 LAB — TSH: TSH: 1.33 m[IU]/L

## 2016-11-11 NOTE — Patient Instructions (Addendum)
HEALTH MAINTENANCE RECOMMENDATIONS:  It is recommended that you get at least 30 minutes of aerobic exercise at least 5 days/week (for weight loss, you may need as much as 60-90 minutes). This can be any activity that gets your heart rate up. This can be divided in 10-15 minute intervals if needed, but try and build up your endurance at least once a week.  Weight bearing exercise is also recommended twice weekly.  Eat a healthy diet with lots of vegetables, fruits and fiber.  "Colorful" foods have a lot of vitamins (ie green vegetables, tomatoes, red peppers, etc).  Limit sweet tea, regular sodas and alcoholic beverages, all of which has a lot of calories and sugar.  Up to 1 alcoholic drink daily may be beneficial for women (unless trying to lose weight, watch sugars).  Drink a lot of water.  Calcium recommendations are 1200-1500 mg daily (1500 mg for postmenopausal women or women without ovaries), and vitamin D 1000 IU daily.  This should be obtained from diet and/or supplements (vitamins), and calcium should not be taken all at once, but in divided doses.  Monthly self breast exams and yearly mammograms for women over the age of 20 is recommended.  Sunscreen of at least SPF 30 should be used on all sun-exposed parts of the skin when outside between the hours of 10 am and 4 pm (not just when at beach or pool, but even with exercise, golf, tennis, and yard work!)  Use a sunscreen that says "broad spectrum" so it covers both UVA and UVB rays, and make sure to reapply every 1-2 hours.  Remember to change the batteries in your smoke detectors when changing your clock times in the spring and fall.  Use your seat belt every time you are in a car, and please drive safely and not be distracted with cell phones and texting while driving.  I recommend getting the new shingles vaccine (Shingrix). You will need to check with your insurance to see if it is covered.  It is a series of 2 injections, spaced 2  months apart.  Please schedule routine GYN exam and your mammogram.  I recommend taking guaifenesin (found in Mucinex--I recommend the 12 hour type, or robitussin (has to be taken more frequently)--either the plain or DM versions. If taking the plain guaifenesin, you may use the Delsym syrup as needed for cough.  Do not use Delsym if using DM version of any medication.  Continue the antihistamines for allergies. I recommend sinus rinses and nasal saline spray as needed.  Contact us if fever persists, sinus pain worsens. expect to feel bad for the first 3-5 days and then start to turn the corner by a week.  We discussed your diet some today--not in detail because you weren't feeling well. Please try and cut back on cholesterol in your diet, to try and get the LDL below 160 (ideally under 130, but under 160 would be acceptable). Remember that some of this isn't under your control, is hereditary.  There are non-statin medications to lower cholesterol, if needed, if you cannot do it with dietary changes alone.  The risks of untreated high cholesterol include risk for cardiovascular disease including stroke, heart disease, peripheral vascular disease.   Fat and Cholesterol Restricted Diet Getting too much fat and cholesterol in your diet may cause health problems. Following this diet helps keep your fat and cholesterol at normal levels. This can keep you from getting sick. What types of fat should I choose?  Choose monosaturated and polyunsaturated fats. These are found in foods such as olive oil, canola oil, flaxseeds, walnuts, almonds, and seeds.  Eat more omega-3 fats. Good choices include salmon, mackerel, sardines, tuna, flaxseed oil, and ground flaxseeds.  Limit saturated fats. These are in animal products such as meats, butter, and cream. They can also be in plant products such as palm oil, palm kernel oil, and coconut oil.  Avoid foods with partially hydrogenated oils in them. These  contain trans fats. Examples of foods that have trans fats are stick margarine, some tub margarines, cookies, crackers, and other baked goods. What general guidelines do I need to follow?  Check food labels. Look for the words "trans fat" and "saturated fat."  When preparing a meal:  Fill half of your plate with vegetables and green salads.  Fill one fourth of your plate with whole grains. Look for the word "whole" as the first word in the ingredient list.  Fill one fourth of your plate with lean protein foods.  Eat more foods that have fiber, like apples, carrots, beans, peas, and barley.  Eat more home-cooked foods. Eat less at restaurants and buffets.  Limit or avoid alcohol.  Limit foods high in starch and sugar.  Limit fried foods.  Cook foods without frying them. Baking, boiling, grilling, and broiling are all great options.  Lose weight if you are overweight. Losing even a small amount of weight can help your overall health. It can also help prevent diseases such as diabetes and heart disease. What foods can I eat? Grains  Whole grains, such as whole wheat or whole grain breads, crackers, cereals, and pasta. Unsweetened oatmeal, bulgur, barley, quinoa, or brown rice. Corn or whole wheat flour tortillas. Vegetables  Fresh or frozen vegetables (raw, steamed, roasted, or grilled). Green salads. Fruits  All fresh, canned (in natural juice), or frozen fruits. Meat and Other Protein Products  Ground beef (85% or leaner), grass-fed beef, or beef trimmed of fat. Skinless chicken or [place]. Ground chicken or [place]. Pork trimmed of fat. All fish and seafood. Eggs. Dried beans, peas, or lentils. Unsalted nuts or seeds. Unsalted canned or dry beans. Dairy  Low-fat dairy products, such as skim or 1% milk, 2% or reduced-fat cheeses, low-fat ricotta or cottage cheese, or plain low-fat yogurt. Fats and Oils  Tub margarines without trans fats. Light or reduced-fat mayonnaise and salad  dressings. Avocado. Olive, canola, sesame, or safflower oils. Natural peanut or almond butter (choose ones without added sugar and oil). The items listed above may not be a complete list of recommended foods or beverages. Contact your dietitian for more options.  What foods are not recommended? Grains  White bread. White pasta. White rice. Cornbread. Bagels, pastries, and croissants. Crackers that contain trans fat. Vegetables  White potatoes. Corn. Creamed or fried vegetables. Vegetables in a cheese sauce. Fruits  Dried fruits. Canned fruit in light or heavy syrup. Fruit juice. Meat and Other Protein Products  Fatty cuts of meat. Ribs, chicken wings, bacon, sausage, bologna, salami, chitterlings, fatback, hot dogs, bratwurst, and packaged luncheon meats. Liver and organ meats. Dairy  Whole or 2% milk, cream, half-and-half, and cream cheese. Whole milk cheeses. Whole-fat or sweetened yogurt. Full-fat cheeses. Nondairy creamers and whipped toppings. Processed cheese, cheese spreads, or cheese curds. Sweets and Desserts  Corn syrup, sugars, honey, and molasses. Candy. Jam and jelly. Syrup. Sweetened cereals. Cookies, pies, cakes, donuts, muffins, and ice cream. Fats and Oils  Butter, stick margarine, lard, shortening, ghee, or  bacon fat. Coconut, palm kernel, or palm oils. Beverages  Alcohol. Sweetened drinks (such as sodas, lemonade, and fruit drinks or punches). The items listed above may not be a complete list of foods and beverages to avoid. Contact your dietitian for more information.  This information is not intended to replace advice given to you by your health care provider. Make sure you discuss any questions you have with your health care provider. Document Released: 01/05/2012 Document Revised: 03/12/2016 Document Reviewed: 10/05/2013 Elsevier Interactive Patient Education  2017 [employer].

## 2016-11-12 ENCOUNTER — Encounter: Payer: Self-pay | Admitting: Family Medicine

## 2016-11-12 ENCOUNTER — Encounter: Payer: BLUE CROSS/BLUE SHIELD | Admitting: Family Medicine

## 2016-12-17 ENCOUNTER — Ambulatory Visit (INDEPENDENT_AMBULATORY_CARE_PROVIDER_SITE_OTHER): Payer: BLUE CROSS/BLUE SHIELD | Admitting: Family Medicine

## 2016-12-17 ENCOUNTER — Encounter: Payer: Self-pay | Admitting: Family Medicine

## 2016-12-17 VITALS — BP 112/70 | HR 81 | Temp 97.9°F | Wt 148.0 lb

## 2016-12-17 DIAGNOSIS — J209 Acute bronchitis, unspecified: Secondary | ICD-10-CM | POA: Diagnosis not present

## 2016-12-17 MED ORDER — AZITHROMYCIN 500 MG PO TABS: 500.0000 mg | ORAL_TABLET | Freq: Every day | ORAL | 0 refills | Status: DC

## 2016-12-17 NOTE — Progress Notes (Signed)
   Subjective:    Patient ID: Margaret BandaLinda Mercer Day, female    DOB: October 30, 1955, 61 y.o.   MRN: [id number]  HPI She states that 6 weeks ago she had the onset of chest congestion and coughing and slight rhinorrhea. The symptoms did get better but did not go away entirely. She continues have difficulty with congestion and cough is now productive with some malaise but no sore throat, earache, fever, chills. She does not smoke. She does state that she has a history of palpitations but has never had a workup for nor had adequate documentation. She does state that sometimes the palpitations are associated with dizziness.   Review of Systems     Objective:   Physical Exam Alert and in no distress. Tympanic membranes and canals are normal. Pharyngeal area is normal. Neck is supple without adenopathy or thyromegaly. Cardiac exam shows a regular sinus rhythm without murmurs or gallops. Lungs are clear to auscultation.        Assessment & Plan:  Acute bronchitis, unspecified organism - Plan: azithromycin (ZITHROMAX) 500 MG tablet  Instructed her to call in 7-10 days if not entirely back to normal. Also demonstrated how to check her carotid pulse when she has the palpitations and keep track of it in terms of rate and regularity. She states that she can usually control this with relaxation techniques.

## 2017-01-04 ENCOUNTER — Other Ambulatory Visit: Payer: Self-pay | Admitting: Family Medicine

## 2017-01-04 ENCOUNTER — Telehealth: Payer: Self-pay | Admitting: Family Medicine

## 2017-01-04 DIAGNOSIS — J209 Acute bronchitis, unspecified: Secondary | ICD-10-CM

## 2017-01-04 MED ORDER — AZITHROMYCIN 500 MG PO TABS: 500.0000 mg | ORAL_TABLET | Freq: Every day | ORAL | 0 refills | Status: DC

## 2017-01-04 NOTE — Telephone Encounter (Signed)
I will call another dose of the antibiotic in

## 2017-01-04 NOTE — Telephone Encounter (Signed)
Pt called states that she is still coughing and is still congested states she is just 75 percent better she is been trying mucinex but cant take to much of it because it makes her sick to her stomach, she took all of the  Antibiotics that was prescribed , she wants to know if you will send her something in or if you need to see her again, [facility] [place] Pt can be reached at [phone]

## 2017-01-05 NOTE — Telephone Encounter (Signed)
Pt aware.

## 2017-02-01 DIAGNOSIS — H40013 Open angle with borderline findings, low risk, bilateral: Secondary | ICD-10-CM | POA: Diagnosis not present

## 2017-03-16 DIAGNOSIS — H6122 Impacted cerumen, left ear: Secondary | ICD-10-CM | POA: Diagnosis not present

## 2017-03-16 DIAGNOSIS — H903 Sensorineural hearing loss, bilateral: Secondary | ICD-10-CM | POA: Diagnosis not present

## 2017-12-08 ENCOUNTER — Encounter: Payer: Self-pay | Admitting: Family Medicine

## 2017-12-08 ENCOUNTER — Ambulatory Visit: Payer: BLUE CROSS/BLUE SHIELD | Admitting: Family Medicine

## 2017-12-08 VITALS — BP 122/74 | HR 92 | Temp 97.4°F | Ht 61.0 in | Wt 154.4 lb

## 2017-12-08 DIAGNOSIS — L84 Corns and callosities: Secondary | ICD-10-CM | POA: Diagnosis not present

## 2017-12-08 DIAGNOSIS — K429 Umbilical hernia without obstruction or gangrene: Secondary | ICD-10-CM

## 2017-12-08 DIAGNOSIS — K439 Ventral hernia without obstruction or gangrene: Secondary | ICD-10-CM

## 2017-12-08 NOTE — Progress Notes (Signed)
   Subjective:    Patient ID: Margaret Day, female    DOB: 1955/07/22, 62 y.o.   MRN: [id number]  HPI She is here for evaluation of possible abdominal hernia.  She has one in the umbilical area and just cephalad to that.  Neither these are causing her any discomfort.  She also has concern over possible plantar wart on her right foot.   Review of Systems     Objective:   Physical Exam Alert and in no distress.  Umbilical exam does show a hernia with the opening being 1-1/2 cm in size.  Proximal to that is another smaller hernia with a less well-defined central defect. Exam of her foot does show a callus present on the medial aspect of the great toe.      Assessment & Plan:  Umbilical hernia without obstruction and without gangrene  Ventral hernia without obstruction or gangrene  Callus of foot  I explained this is she does not really have any discomfort with the hernias, surgical intervention is not necessary.  If she develops pain or feeling of tissue being stuck in there then further surgical intervention could be considered. I then discussed the fact that she should use a pumice stone to take care of the callus and keep that under control.  She expressed understanding of this.

## 2017-12-20 ENCOUNTER — Other Ambulatory Visit: Payer: Self-pay

## 2017-12-20 DIAGNOSIS — I83892 Varicose veins of left lower extremities with other complications: Secondary | ICD-10-CM

## 2017-12-31 ENCOUNTER — Encounter: Payer: Self-pay | Admitting: Vascular Surgery

## 2017-12-31 ENCOUNTER — Encounter (HOSPITAL_COMMUNITY): Payer: Self-pay

## 2018-01-19 ENCOUNTER — Encounter (HOSPITAL_COMMUNITY): Payer: Self-pay

## 2018-01-19 ENCOUNTER — Encounter: Payer: Self-pay | Admitting: Vascular Surgery

## 2018-02-07 DIAGNOSIS — E785 Hyperlipidemia, unspecified: Secondary | ICD-10-CM | POA: Diagnosis not present

## 2018-02-07 DIAGNOSIS — K429 Umbilical hernia without obstruction or gangrene: Secondary | ICD-10-CM | POA: Diagnosis not present

## 2018-02-07 DIAGNOSIS — R7301 Impaired fasting glucose: Secondary | ICD-10-CM | POA: Diagnosis not present

## 2018-02-07 DIAGNOSIS — E559 Vitamin D deficiency, unspecified: Secondary | ICD-10-CM | POA: Diagnosis not present

## 2018-03-09 DIAGNOSIS — K429 Umbilical hernia without obstruction or gangrene: Secondary | ICD-10-CM | POA: Diagnosis not present

## 2018-03-09 DIAGNOSIS — K439 Ventral hernia without obstruction or gangrene: Secondary | ICD-10-CM | POA: Diagnosis not present

## 2018-03-15 DIAGNOSIS — H903 Sensorineural hearing loss, bilateral: Secondary | ICD-10-CM | POA: Diagnosis not present

## 2018-03-15 DIAGNOSIS — H6122 Impacted cerumen, left ear: Secondary | ICD-10-CM | POA: Diagnosis not present

## 2018-03-23 ENCOUNTER — Ambulatory Visit: Payer: BLUE CROSS/BLUE SHIELD | Admitting: Vascular Surgery

## 2018-03-23 ENCOUNTER — Other Ambulatory Visit: Payer: Self-pay

## 2018-03-23 ENCOUNTER — Ambulatory Visit (HOSPITAL_COMMUNITY)
Admission: RE | Admit: 2018-03-23 | Discharge: 2018-03-23 | Disposition: A | Discharge status: Home / Self Care | Payer: BLUE CROSS/BLUE SHIELD | Source: Ambulatory Visit | Attending: Vascular Surgery | Admitting: Vascular Surgery

## 2018-03-23 ENCOUNTER — Encounter: Payer: Self-pay | Admitting: Vascular Surgery

## 2018-03-23 VITALS — BP 145/93 | HR 88 | Temp 98.5°F | Resp 20 | Ht 62.0 in | Wt 153.0 lb

## 2018-03-23 DIAGNOSIS — I83892 Varicose veins of left lower extremities with other complications: Secondary | ICD-10-CM | POA: Diagnosis not present

## 2018-03-23 DIAGNOSIS — I83812 Varicose veins of left lower extremities with pain: Secondary | ICD-10-CM

## 2018-03-23 NOTE — Progress Notes (Signed)
REASON FOR CONSULT:    Varicose veins left leg.  The patient is a self-referral.  HPI:   Margaret Day is a pleasant 62 y.o. female, who presents for evaluation of bilateral varicose veins.  She has had varicose veins for many years.  She describes some aching pain and throbbing pain in her legs which is aggravated by standing and sitting relieved somewhat with elevation.  Her symptoms are more significant on the left side.  She is unaware of any previous history of DVT or phlebitis.  Her symptoms are relieved somewhat with elevation.  She does not require ibuprofen.  Past Medical History:  Diagnosis Date  . Allergy    seasonal  . Hyperlipidemia   . Palpitations    h/o, probably benign  . Reflux    normal EGD 2009    Family History  Problem Relation Age of Onset  . Down syndrome Brother   . GER disease Brother   . Diabetes Father   . Heart disease Father   . Colon polyps Mother   . Hypertension Mother   . Hyperlipidemia Mother   . Cancer Mother        skin  . Urolithiasis Sister   . Healthy Brother   . Autism Son   . Autism Son   . Cancer Maternal Grandmother        colon  . Colon cancer Maternal Grandmother     SOCIAL HISTORY: Social History   Socioeconomic History  . Marital status: Married    Spouse name: Not on file  . Number of children: Not on file  . Years of education: Not on file  . Highest education level: Not on file  Occupational History  . Not on file  Social Needs  . Financial resource strain: Not on file  . Food insecurity:    Worry: Not on file    Inability: Not on file  . Transportation needs:    Medical: Not on file    Non-medical: Not on file  Tobacco Use  . Smoking status: Never Smoker  . Smokeless tobacco: Never Used  Substance and Sexual Activity  . Alcohol use: Yes    Alcohol/week: 0.0 standard drinks    Comment: rare, small amount of wine  . Drug use: No  . Sexual activity: Not Currently    Partners: Male  Lifestyle   . Physical activity:    Days per week: Not on file    Minutes per session: Not on file  . Stress: Not on file  Relationships  . Social connections:    Talks on phone: Not on file    Gets together: Not on file    Attends religious service: Not on file    Active member of club or organization: Not on file    Attends meetings of clubs or organizations: Not on file    Relationship status: Not on file  . Intimate partner violence:    Fear of current or ex partner: Not on file    Emotionally abused: Not on file    Physically abused: Not on file    Forced sexual activity: Not on file  Other Topics Concern  . Not on file  Social History Narrative   Degree in plant breeding genetics.  Hasn't worked since 1997, other than p/t Lawyer.  Homeschooled her children, not working outside the home. Lives at home with husband, 2 sons, 1 daughter. 1 dog.   Daughter [name] is at school at [employer]  State   Sam graduated from [employer] is attending [employer]    Allergies  Allergen Reactions  . Aspirin Other (See Comments)    Cannot take due to ulcer.; EGD from 2009 reviewed--normal, no ulcer  . Dairy Aid [Lactase] Other (See Comments)    Headaches.  . Gluten Meal Other (See Comments)    Makes her feel foggy.  Marland Kitchen Penicillins Other (See Comments)    Facial swelling and breathing trouble.  . Sulfa Antibiotics Nausea Only    Current Outpatient Medications  Medication Sig Dispense Refill  . b complex vitamins tablet Take 1 tablet by mouth daily. Reported on 09/02/2015    . Cholecalciferol (VITAMIN D) 1000 UNITS capsule Take 1,000 Units by mouth daily. Reported on 09/02/2015    . Coenzyme Q10 (CO Q 10 PO) Take 50 mg by mouth daily. Reported on 09/02/2015    . fexofenadine (ALLEGRA) 180 MG tablet Take 180 mg by mouth daily. Reported on 09/02/2015    . vitamin C (ASCORBIC ACID) 500 MG tablet Take 500 mg by mouth daily.    . vitamin E 100 UNIT capsule Take 100 Units by mouth  daily. Reported on 09/02/2015    . Pseudoephedrine HCl (SUDAFED PO) Take by mouth.     No current facility-administered medications for this visit.     REVIEW OF SYSTEMS:  [X]  denotes positive finding, [ ]  denotes negative finding Cardiac  Comments:  Chest pain or chest pressure:    Shortness of breath upon exertion:    Short of breath when lying flat:    Irregular heart rhythm: x       Vascular    Pain in calf, thigh, or hip brought on by ambulation:    Pain in feet at night that wakes you up from your sleep:     Blood clot in your veins:    Leg swelling:         Pulmonary    Oxygen at home:    Productive cough:     Wheezing:         Neurologic    Sudden weakness in arms or legs:     Sudden numbness in arms or legs:     Sudden onset of difficulty speaking or slurred speech:    Temporary loss of vision in one eye:     Problems with dizziness:         Gastrointestinal    Blood in stool:     Vomited blood:         Genitourinary    Burning when urinating:     Blood in urine:        Psychiatric    Major depression:         Hematologic    Bleeding problems:    Problems with blood clotting too easily:        Skin    Rashes or ulcers:        Constitutional    Fever or chills:     PHYSICAL EXAM:   Vitals:   03/23/18 1445  BP: (!) 145/93  Pulse: 88  Resp: 20  Temp: 98.5 F (36.9 C)  TempSrc: Oral  SpO2: 96%  Weight: 153 lb (69.4 kg)  Height: 5\' 2"  (1.575 m)    GENERAL: The patient is a well-nourished female, in no acute distress. The vital signs are documented above. CARDIAC: There is a regular rate and rhythm.  VASCULAR:  ARTERIAL: I do not detect carotid  bruits. She has palpable femoral and pedal pulses. VENOUS: She does have spider veins in the anterior and medial left calf and also on the lateral aspect of her left calf and left thigh.  On the right side she has some spider veins in the popliteal fossa.  She does not have any significantly dilated  varicose veins.  She does not have significant swelling or hyperpigmentation. PULMONARY: There is good air exchange bilaterally without wheezing or rales. ABDOMEN: Soft and non-tender with normal pitched bowel sounds.  MUSCULOSKELETAL: There are no major deformities or cyanosis. NEUROLOGIC: No focal weakness or paresthesias are detected. SKIN: There are no ulcers or rashes noted. PSYCHIATRIC: The patient has a normal affect.  DATA:    VENOUS DUPLEX: I have independently interpreted her venous duplex scan today.  On the right side there is no evidence of DVT or superficial thrombophlebitis.  There is some reflux in the common femoral vein on the right.  There is no significant superficial venous reflux.  On the left side there is no evidence of DVT or superficial thrombophlebitis.  There is some deep venous reflux involving the common femoral vein.  There is no significant superficial venous reflux.   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  She does not have significant reflux in the superficial system on either side.  We have discussed the importance of intermittent leg elevation the proper positioning for this.  I encouraged her to wear compression stockings and I have given her a prescription for knee-high compression stockings with a gradient of 15 to 20 mmHg.  I encouraged her to avoid prolonged sitting and standing.  We discussed the importance of exercise specifically at walking and water aerobics.  In addition we discussed the importance of weight management.  She would like to consider sclerotherapy and I think she would be a reasonable candidate for this.  She would likely require 1 to 2 units.  She will call to discuss this with Marisue Ivan.    Waverly Ferrari [facility] [phone]

## 2018-04-26 ENCOUNTER — Ambulatory Visit: Payer: Self-pay | Admitting: *Deleted

## 2018-04-26 ENCOUNTER — Ambulatory Visit: Payer: BLUE CROSS/BLUE SHIELD

## 2018-04-26 DIAGNOSIS — I8393 Asymptomatic varicose veins of bilateral lower extremities: Secondary | ICD-10-CM

## 2018-04-26 DIAGNOSIS — I781 Nevus, non-neoplastic: Secondary | ICD-10-CM

## 2018-04-26 NOTE — Progress Notes (Signed)
X=.3% Sotradecol administered with a 27g butterfly.  Patient received a total of 12cc.  Treated all areas of concern for this nice lady. Easy access. Tol well. Anticipate good results. Follow prn.  Photos: Yes.    Compression stockings applied: Yes.

## 2018-04-27 ENCOUNTER — Encounter: Payer: Self-pay | Admitting: Vascular Surgery

## 2018-06-02 ENCOUNTER — Other Ambulatory Visit: Payer: Self-pay | Admitting: Family Medicine

## 2018-06-02 DIAGNOSIS — Z1231 Encounter for screening mammogram for malignant neoplasm of breast: Secondary | ICD-10-CM

## 2018-06-22 NOTE — Progress Notes (Signed)
Margaret Day [facility] to her LT deltoid by the undersigned.  Lot#[id number] NDC: [id number] Mfg: [id number] Exp: 01/17/19

## 2018-07-05 ENCOUNTER — Encounter: Payer: Self-pay | Admitting: Vascular Surgery

## 2018-07-14 ENCOUNTER — Ambulatory Visit: Payer: BLUE CROSS/BLUE SHIELD

## 2018-10-31 DIAGNOSIS — E785 Hyperlipidemia, unspecified: Secondary | ICD-10-CM | POA: Diagnosis not present

## 2018-10-31 DIAGNOSIS — R7301 Impaired fasting glucose: Secondary | ICD-10-CM | POA: Diagnosis not present

## 2018-10-31 DIAGNOSIS — E559 Vitamin D deficiency, unspecified: Secondary | ICD-10-CM | POA: Diagnosis not present

## 2018-11-04 DIAGNOSIS — E559 Vitamin D deficiency, unspecified: Secondary | ICD-10-CM | POA: Diagnosis not present

## 2018-11-04 DIAGNOSIS — E785 Hyperlipidemia, unspecified: Secondary | ICD-10-CM | POA: Diagnosis not present

## 2018-11-04 DIAGNOSIS — E663 Overweight: Secondary | ICD-10-CM | POA: Diagnosis not present

## 2019-03-06 DIAGNOSIS — D0472 Carcinoma in situ of skin of left lower limb, including hip: Secondary | ICD-10-CM | POA: Diagnosis not present

## 2019-03-06 DIAGNOSIS — L821 Other seborrheic keratosis: Secondary | ICD-10-CM | POA: Diagnosis not present

## 2019-03-06 DIAGNOSIS — D1801 Hemangioma of skin and subcutaneous tissue: Secondary | ICD-10-CM | POA: Diagnosis not present

## 2019-04-13 DIAGNOSIS — J309 Allergic rhinitis, unspecified: Secondary | ICD-10-CM | POA: Diagnosis not present

## 2019-04-13 DIAGNOSIS — R51 Headache: Secondary | ICD-10-CM | POA: Diagnosis not present

## 2019-05-04 DIAGNOSIS — E559 Vitamin D deficiency, unspecified: Secondary | ICD-10-CM | POA: Diagnosis not present

## 2019-05-04 DIAGNOSIS — E785 Hyperlipidemia, unspecified: Secondary | ICD-10-CM | POA: Diagnosis not present

## 2019-05-09 ENCOUNTER — Other Ambulatory Visit: Payer: Self-pay | Admitting: Family Medicine

## 2019-05-09 ENCOUNTER — Other Ambulatory Visit (HOSPITAL_COMMUNITY)
Admission: RE | Admit: 2019-05-09 | Discharge: 2019-05-09 | Disposition: A | Discharge status: Home / Self Care | Payer: BC Managed Care – PPO | Source: Ambulatory Visit | Attending: Family Medicine | Admitting: Family Medicine

## 2019-05-09 DIAGNOSIS — Z124 Encounter for screening for malignant neoplasm of cervix: Secondary | ICD-10-CM | POA: Insufficient documentation

## 2019-05-09 DIAGNOSIS — Z0000 Encounter for general adult medical examination without abnormal findings: Secondary | ICD-10-CM | POA: Diagnosis not present

## 2019-05-09 DIAGNOSIS — Z23 Encounter for immunization: Secondary | ICD-10-CM | POA: Diagnosis not present

## 2019-05-09 DIAGNOSIS — E785 Hyperlipidemia, unspecified: Secondary | ICD-10-CM | POA: Diagnosis not present

## 2019-05-11 ENCOUNTER — Other Ambulatory Visit: Payer: Self-pay | Admitting: Family Medicine

## 2019-05-11 DIAGNOSIS — Z1231 Encounter for screening mammogram for malignant neoplasm of breast: Secondary | ICD-10-CM

## 2019-05-11 LAB — CYTOLOGY - PAP
Comment: NEGATIVE
Diagnosis: NEGATIVE
High risk HPV: NEGATIVE

## 2019-06-08 DIAGNOSIS — K439 Ventral hernia without obstruction or gangrene: Secondary | ICD-10-CM | POA: Diagnosis not present

## 2019-06-08 DIAGNOSIS — K429 Umbilical hernia without obstruction or gangrene: Secondary | ICD-10-CM | POA: Diagnosis not present

## 2019-07-04 ENCOUNTER — Other Ambulatory Visit: Payer: Self-pay

## 2019-07-04 ENCOUNTER — Ambulatory Visit
Admission: RE | Admit: 2019-07-04 | Discharge: 2019-07-04 | Disposition: A | Discharge status: Home / Self Care | Payer: BLUE CROSS/BLUE SHIELD | Source: Ambulatory Visit | Attending: Family Medicine | Admitting: Family Medicine

## 2019-07-04 DIAGNOSIS — Z1231 Encounter for screening mammogram for malignant neoplasm of breast: Secondary | ICD-10-CM

## 2019-07-05 ENCOUNTER — Other Ambulatory Visit: Payer: Self-pay | Admitting: Family Medicine

## 2019-07-05 DIAGNOSIS — R928 Other abnormal and inconclusive findings on diagnostic imaging of breast: Secondary | ICD-10-CM

## 2019-07-12 ENCOUNTER — Ambulatory Visit
Admission: RE | Admit: 2019-07-12 | Discharge: 2019-07-12 | Disposition: A | Discharge status: Home / Self Care | Payer: BC Managed Care – PPO | Source: Ambulatory Visit | Attending: Family Medicine | Admitting: Family Medicine

## 2019-07-12 ENCOUNTER — Other Ambulatory Visit: Payer: Self-pay | Admitting: Family Medicine

## 2019-07-12 ENCOUNTER — Other Ambulatory Visit: Payer: Self-pay

## 2019-07-12 DIAGNOSIS — R928 Other abnormal and inconclusive findings on diagnostic imaging of breast: Secondary | ICD-10-CM

## 2019-07-12 DIAGNOSIS — R921 Mammographic calcification found on diagnostic imaging of breast: Secondary | ICD-10-CM | POA: Diagnosis not present

## 2019-11-02 DIAGNOSIS — Z01818 Encounter for other preprocedural examination: Secondary | ICD-10-CM | POA: Diagnosis not present

## 2019-11-06 DIAGNOSIS — K4090 Unilateral inguinal hernia, without obstruction or gangrene, not specified as recurrent: Secondary | ICD-10-CM | POA: Diagnosis not present

## 2019-11-06 DIAGNOSIS — K429 Umbilical hernia without obstruction or gangrene: Secondary | ICD-10-CM | POA: Diagnosis not present

## 2019-11-06 DIAGNOSIS — K439 Ventral hernia without obstruction or gangrene: Secondary | ICD-10-CM | POA: Diagnosis not present

## 2020-01-16 ENCOUNTER — Ambulatory Visit
Admission: RE | Admit: 2020-01-16 | Discharge: 2020-01-16 | Disposition: A | Discharge status: Home / Self Care | Payer: BC Managed Care – PPO | Source: Ambulatory Visit | Attending: Family Medicine | Admitting: Family Medicine

## 2020-01-16 ENCOUNTER — Other Ambulatory Visit: Payer: Self-pay

## 2020-01-16 DIAGNOSIS — R921 Mammographic calcification found on diagnostic imaging of breast: Secondary | ICD-10-CM

## 2020-10-04 IMAGING — MG DIGITAL DIAGNOSTIC UNILAT RIGHT W/ CAD
4 series · 4 of 4 positions shown · non-contrast
Comparison: Previous studies from 4114 and 1939.

CLINICAL DATA: The patient was called back for right breast
calcifications.

EXAM:
DIGITAL DIAGNOSTIC RIGHT MAMMOGRAM WITH TOMO

[R ML (1 of 2)]
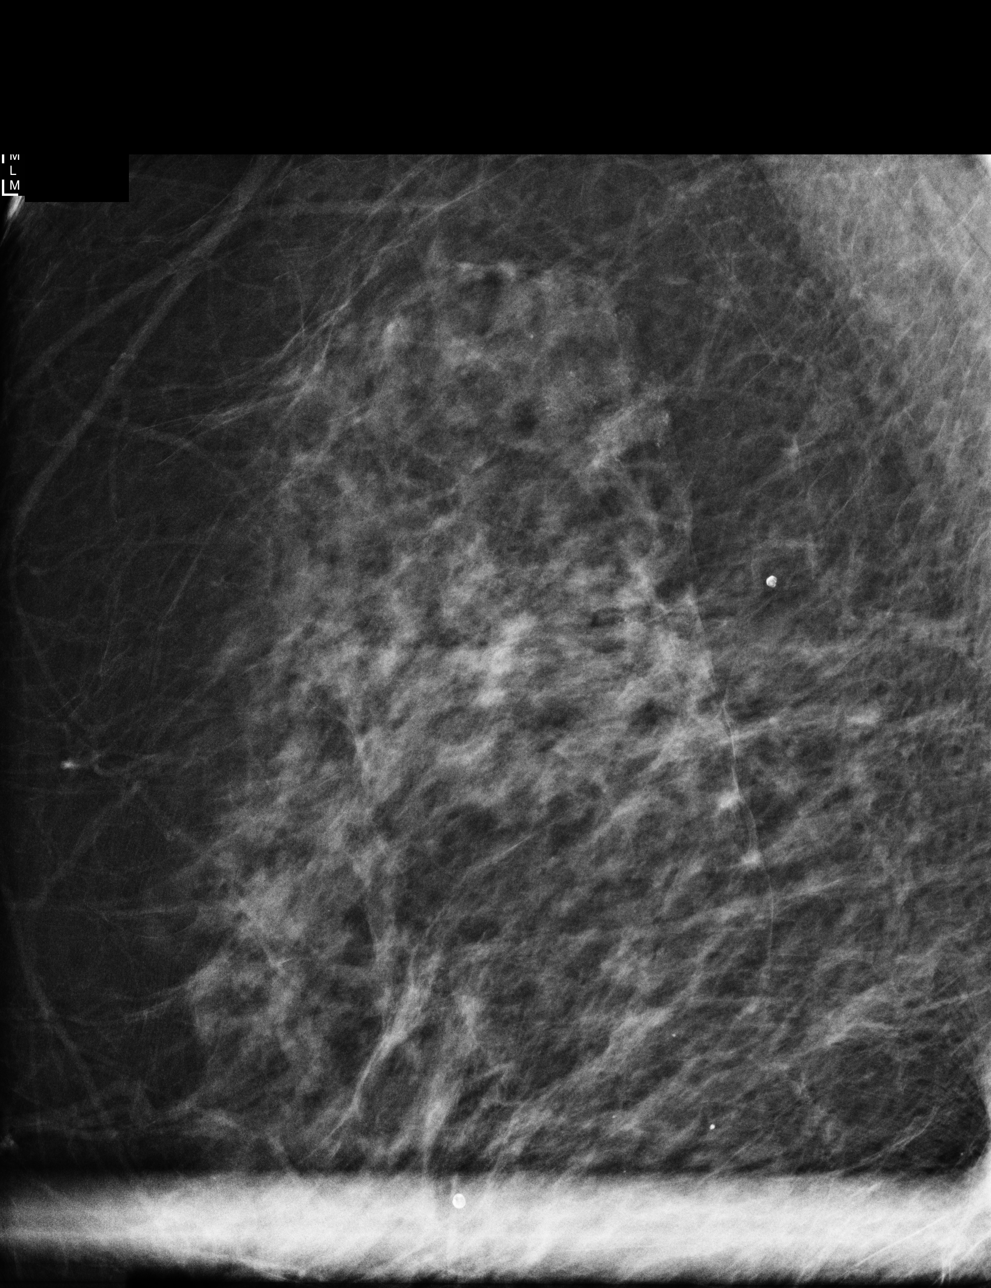

[R XCCL (1 of 2)]
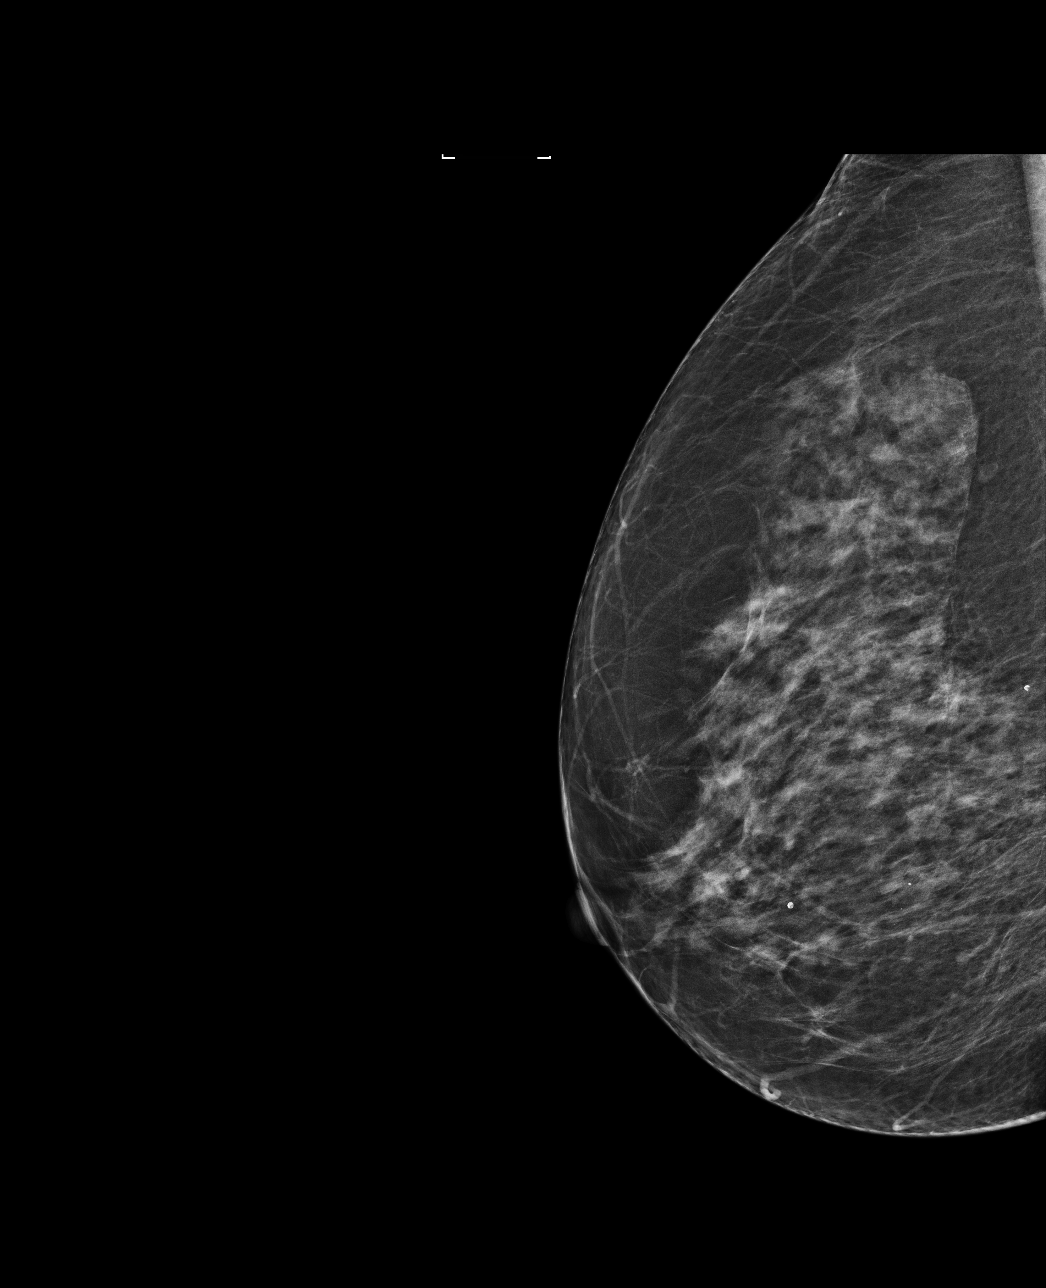

[R ML (2 of 2)]
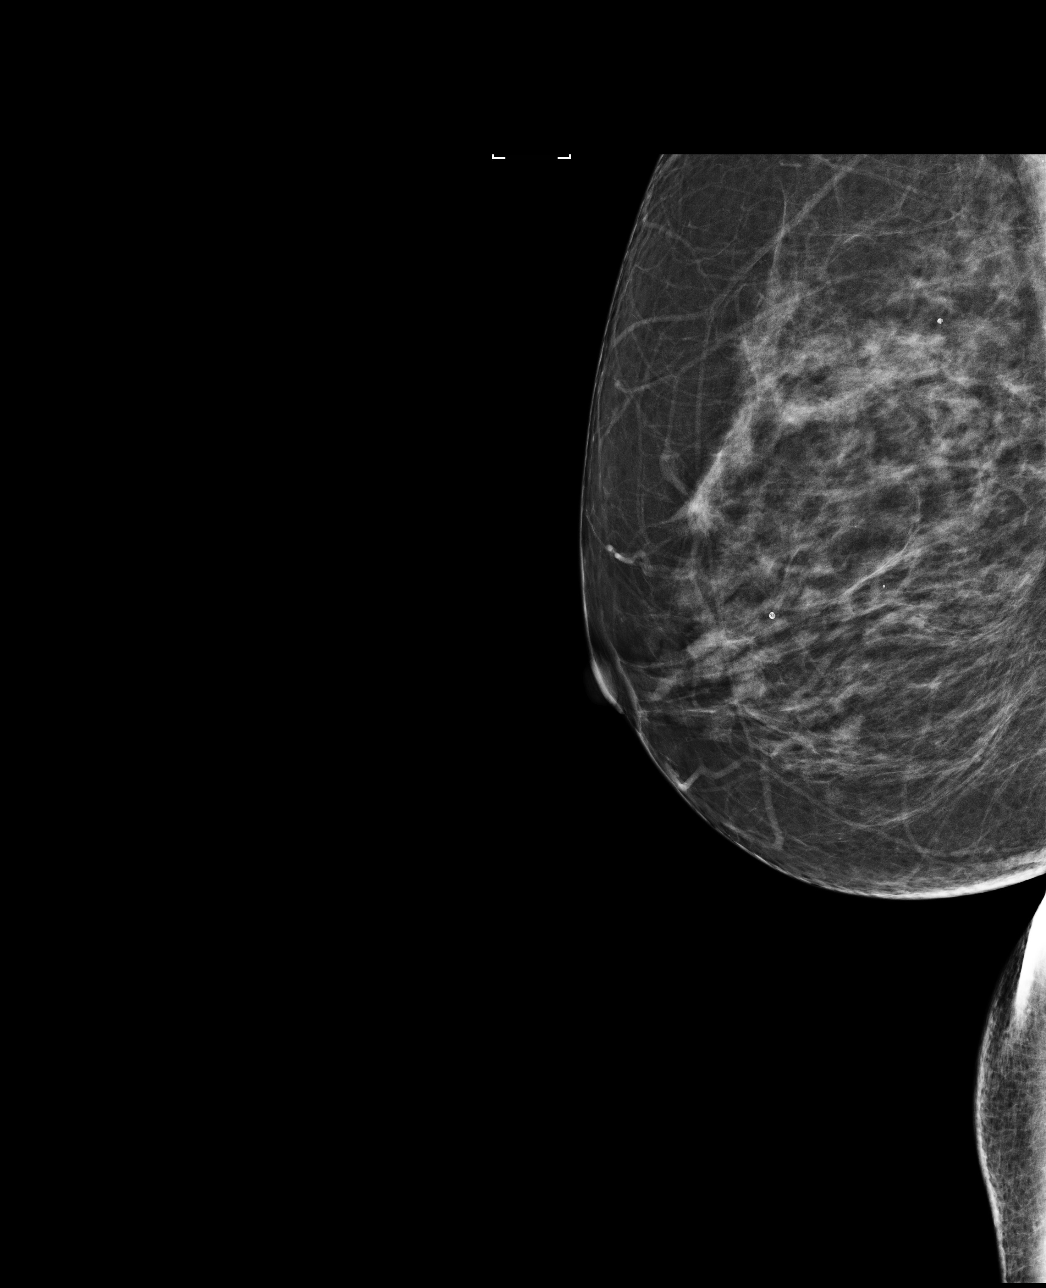

[R XCCL (2 of 2)]
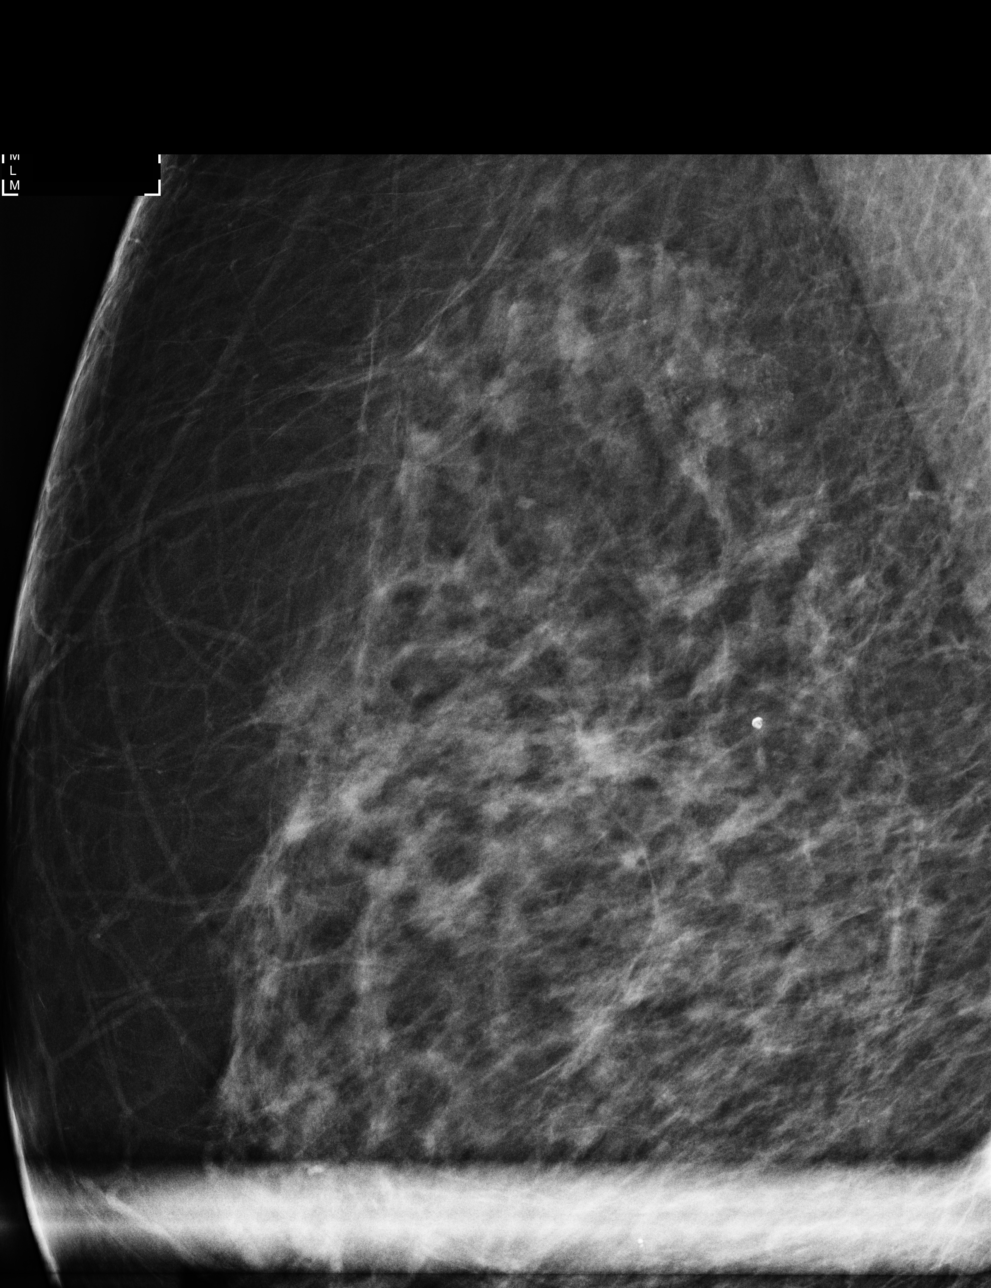

[4 of 4 positions shown; findings below may reference images not displayed]

ACR Breast Density Category c: The breast tissue is heterogeneously
dense, which may obscure small masses.
FINDINGS: There are very subtle round and punctate calcifications in the upper
outer right breast spanning 1.7 cm. It is difficult to compare to
remote priors given difference in technique.
IMPRESSION: Probably benign right breast calcifications.

RECOMMENDATION:
Six-month follow-up mammography of the probably benign right breast
calcifications.

I have discussed the findings and recommendations with the patient.
If applicable, a reminder letter will be sent to the patient
regarding the next appointment.

BI-RADS CATEGORY  3: Probably benign.

## 2021-04-21 ENCOUNTER — Other Ambulatory Visit: Payer: Self-pay | Admitting: Family Medicine

## 2021-04-21 DIAGNOSIS — Z09 Encounter for follow-up examination after completed treatment for conditions other than malignant neoplasm: Secondary | ICD-10-CM

## 2021-05-21 ENCOUNTER — Other Ambulatory Visit: Payer: Self-pay | Admitting: Family Medicine

## 2021-05-21 DIAGNOSIS — R921 Mammographic calcification found on diagnostic imaging of breast: Secondary | ICD-10-CM

## 2021-05-23 ENCOUNTER — Ambulatory Visit
Admission: RE | Admit: 2021-05-23 | Discharge: 2021-05-23 | Disposition: A | Discharge status: Home / Self Care | Payer: 59 | Source: Ambulatory Visit | Attending: Family Medicine | Admitting: Family Medicine

## 2021-05-23 ENCOUNTER — Other Ambulatory Visit: Payer: Self-pay

## 2021-05-23 DIAGNOSIS — R921 Mammographic calcification found on diagnostic imaging of breast: Secondary | ICD-10-CM

## 2021-07-01 ENCOUNTER — Ambulatory Visit: Payer: 59

## 2021-08-11 ENCOUNTER — Ambulatory Visit (INDEPENDENT_AMBULATORY_CARE_PROVIDER_SITE_OTHER): Payer: 59

## 2021-08-11 ENCOUNTER — Other Ambulatory Visit: Payer: Self-pay

## 2021-08-11 DIAGNOSIS — I8393 Asymptomatic varicose veins of bilateral lower extremities: Secondary | ICD-10-CM

## 2021-08-11 NOTE — Progress Notes (Signed)
Pt.'s large areas of spider veins treated with Asclera 1%. MD came in to assess prior to start of treatment. Pt had a treatment previously here, several years ago. Pt received a total of 4 mL. Very easy access. Pt was quite jumpy and anxious but was able to tolerate treatment. She only wanted two syringes, so two large areas were treated (one on each leg). Pt was given post procedure care instructions verbally and on handout. Compression stockings were applied. Will follow PRN.

## 2021-12-01 ENCOUNTER — Other Ambulatory Visit: Payer: Self-pay | Admitting: Otolaryngology

## 2021-12-01 DIAGNOSIS — J329 Chronic sinusitis, unspecified: Secondary | ICD-10-CM

## 2021-12-01 DIAGNOSIS — M542 Cervicalgia: Secondary | ICD-10-CM

## 2022-01-06 ENCOUNTER — Other Ambulatory Visit: Payer: Self-pay

## 2022-01-06 ENCOUNTER — Encounter (HOSPITAL_COMMUNITY): Payer: Self-pay | Admitting: Emergency Medicine

## 2022-01-06 ENCOUNTER — Emergency Department (HOSPITAL_COMMUNITY)
Admission: EM | Admit: 2022-01-06 | Discharge: 2022-01-06 | Disposition: A | Discharge status: Home / Self Care | Payer: 59 | Attending: Emergency Medicine | Admitting: Emergency Medicine

## 2022-01-06 DIAGNOSIS — R42 Dizziness and giddiness: Secondary | ICD-10-CM | POA: Diagnosis present

## 2022-01-06 DIAGNOSIS — I471 Supraventricular tachycardia: Secondary | ICD-10-CM | POA: Insufficient documentation

## 2022-01-06 LAB — BASIC METABOLIC PANEL
Anion gap: 11 (ref 5–15)
BUN: 21 mg/dL (ref 8–23)
CO2: 23 mmol/L (ref 22–32)
Calcium: 10.1 mg/dL (ref 8.9–10.3)
Chloride: 107 mmol/L (ref 98–111)
Creatinine, Ser: 0.78 mg/dL (ref 0.44–1.00)
GFR, Estimated: >60 mL/min (ref >60)
Glucose, Bld: 128 mg/dL — ABNORMAL HIGH (ref 70–99)
Potassium: 3.6 mmol/L (ref 3.5–5.1)
Sodium: 141 mmol/L (ref 135–145)

## 2022-01-06 LAB — CBC WITH DIFFERENTIAL/PLATELET
Abs Immature Granulocytes: 0.02 10*3/uL (ref 0.00–0.07)
Basophils Absolute: 0.1 10*3/uL (ref 0.0–0.1)
Basophils Relative: 1 %
Eosinophils Absolute: 0.2 10*3/uL (ref 0.0–0.5)
Eosinophils Relative: 3 %
HCT: 42 % (ref 36.0–46.0)
Hemoglobin: 14.8 g/dL (ref 12.0–15.0)
Immature Granulocytes: 0 %
Lymphocytes Relative: 36 %
Lymphs Abs: 2.4 10*3/uL (ref 0.7–4.0)
MCH: 31.9 pg (ref 26.0–34.0)
MCHC: 35.2 g/dL (ref 30.0–36.0)
MCV: 90.5 fL (ref 80.0–100.0)
Monocytes Absolute: 0.8 10*3/uL (ref 0.1–1.0)
Monocytes Relative: 12 %
Neutro Abs: 3.3 10*3/uL (ref 1.7–7.7)
Neutrophils Relative %: 48 %
Platelets: 277 10*3/uL (ref 150–400)
RBC: 4.64 MIL/uL (ref 3.87–5.11)
RDW: 12.1 % (ref 11.5–15.5)
WBC: 6.7 10*3/uL (ref 4.0–10.5)
nRBC: 0 % (ref 0.0–0.2)

## 2022-01-06 LAB — MAGNESIUM: Magnesium: 2.1 mg/dL (ref 1.7–2.4)

## 2022-01-06 NOTE — ED Provider Notes (Signed)
[facility] EMERGENCY DEPARTMENT Provider Note   CSN: [id number] Arrival date & time: 01/06/22  1062     History  Chief Complaint  Patient presents with   Anxiety    SVT resolved    Margaret Day is a 66 y.o. female.  Presents to the ER due to concern for lightheadedness.  Patient states that yesterday night she was feeling lightheaded, anxious, felt like she was going to fall.  Did not feel like she was going to pass out and never passed out.  No chest pain.  EMS was contacted.  They report intermittent SVT, resolved prior to ER.  Patient denies any major medical problems.  Non-smoker.  HPI     Home Medications Prior to Admission medications   Medication Sig Start Date End Date Taking? Authorizing Provider  b complex vitamins tablet Take 1 tablet by mouth daily. Reported on 09/02/2015    [provider]  Cholecalciferol (VITAMIN D) 1000 UNITS capsule Take 1,000 Units by mouth daily. Reported on 09/02/2015    [provider]  Coenzyme Q10 (CO Q 10 PO) Take 50 mg by mouth daily. Reported on 09/02/2015    [provider]  fexofenadine (ALLEGRA) 180 MG tablet Take 180 mg by mouth daily. Reported on 09/02/2015    [provider]  Pseudoephedrine HCl (SUDAFED PO) Take by mouth.    [provider]  vitamin C (ASCORBIC ACID) 500 MG tablet Take 500 mg by mouth daily.    [provider]  vitamin E 100 UNIT capsule Take 100 Units by mouth daily. Reported on 09/02/2015    [provider]      Allergies    Aspirin, Dairy aid [tilactase], Gluten meal, Penicillins, and Sulfa antibiotics    Review of Systems   Review of Systems  Constitutional:  Negative for chills and fever.  HENT:  Negative for ear pain and sore throat.   Eyes:  Negative for pain and visual disturbance.  Respiratory:  Negative for cough and shortness of breath.   Cardiovascular:  Negative for chest pain and palpitations.   Gastrointestinal:  Negative for abdominal pain and vomiting.  Genitourinary:  Negative for dysuria and hematuria.  Musculoskeletal:  Negative for arthralgias and back pain.  Skin:  Negative for color change and rash.  Neurological:  Positive for light-headedness. Negative for seizures and syncope.  All other systems reviewed and are negative.   Physical Exam Updated Vital Signs BP 127/64   Pulse 81   Temp 98.4 F (36.9 C) (Oral)   Resp 16   LMP 07/20/2006   SpO2 99%  Physical Exam Vitals and nursing note reviewed.  Constitutional:      General: She is not in acute distress.    Appearance: She is well-developed.  HENT:     Head: Normocephalic and atraumatic.  Eyes:     Conjunctiva/sclera: Conjunctivae normal.  Cardiovascular:     Rate and Rhythm: Normal rate and regular rhythm.     Heart sounds: No murmur heard. Pulmonary:     Effort: Pulmonary effort is normal. No respiratory distress.     Breath sounds: Normal breath sounds.  Abdominal:     Palpations: Abdomen is soft.     Tenderness: There is no abdominal tenderness.  Musculoskeletal:        General: No swelling.     Cervical back: Neck supple.  Skin:    General: Skin is warm and dry.     Capillary Refill: Capillary refill  takes less than 2 seconds.  Neurological:     Mental Status: She is alert.  Psychiatric:        Mood and Affect: Mood normal.     ED Results / Procedures / Treatments   Labs (all labs ordered are listed, but only abnormal results are displayed) Labs Reviewed  BASIC METABOLIC PANEL - Abnormal; Notable for the following components:      Result Value   Glucose, Bld 128 (*)    All other components within normal limits  CBC WITH DIFFERENTIAL/PLATELET  MAGNESIUM    EKG EKG Interpretation  Date/Time:  Tuesday January 06 2022 03:30:50 EDT Ventricular Rate:  92 PR Interval:  182 QRS Duration: 96 QT Interval:  388 QTC Calculation: 479 R Axis:   9 Text Interpretation: Normal sinus rhythm  Nonspecific ST abnormality Abnormal ECG No previous ECGs available Confirmed by Marianna Fuss ([id number]) on 01/06/2022 9:24:10 AM  Radiology No results found.  Procedures Procedures    Medications Ordered in ED Medications - No data to display  ED Course/ Medical Decision Making/ A&P                           Medical Decision Making  66 year old lady presenting to ER due to concern for lightheadedness.  Came by EMS, report of intermittent SVT which resolved prior to arrival.  Here patient is well-appearing and has grossly normal vital signs.  Her EKG demonstrates normal sinus rhythm.  She has no associated chest pain, no acute ischemic changes on EKG, doubt ACS.  No electrolyte derangements.  No anemia.  Patient has been symptom-free since being in the ER for the past 7 hours.  Repeat vital signs remain normal.  Her heart sounds continues to be regular and normal rate.  Given this episode of possible intermittent SVT, advised follow-up with cardiology, consideration for at home heart monitor.  Given work-up and lack of any ongoing symptoms and normal vital signs, do not feel patient requires admission at this time to be discharged home.  Reviewed return precautions with patient and husband, additionally advised recheck with PCP.  After the discussed management above, the patient was determined to be safe for discharge.  The patient was in agreement with this plan and all questions regarding their care were answered.  ED return precautions were discussed and the patient will return to the ED with any significant worsening of condition.        Final Clinical Impression(s) / ED Diagnoses Final diagnoses:  SVT (supraventricular tachycardia) (HCC)    Rx / DC Orders ED Discharge Orders          Ordered    Ambulatory referral to Cardiology       Comments: If you have not heard from the Cardiology office within the next 72 hours please call [phone].   01/06/22 1040               Milagros Loll, MD 01/06/22 1050

## 2022-01-06 NOTE — ED Provider Triage Note (Signed)
  Emergency Medicine Provider Triage Evaluation Note  MRN:  [id number]  Arrival date & time: 01/06/22    Medically screening exam initiated at 3:36 AM.   CC:   Anxiety (SVT resolved)   HPI:  Margaret Day is a 66 y.o. year-old female presents to the ED with chief complaint of lightheadedness.  Woke up because of a cramp in her toe and felt like she was going to pass out.  EMS was called an reported SVT, which has resolved.  History provided by History provided by patient. ROS:  -As included in HPI PE:   Vitals:   01/06/22 0332  BP: (!) 158/78  Pulse: 87  Resp: 16  Temp: 98.4 F (36.9 C)  SpO2: 96%    Non-toxic appearing No respiratory distress Normal rate A bit anxious MDM:   I've ordered labs and EKG in triage to expedite lab/diagnostic workup.  Patient was informed that the remainder of the evaluation will be completed by another provider, this initial triage assessment does not replace that evaluation, and the importance of remaining in the ED until their evaluation is complete.    Roxy Horseman, PA-C 01/06/22 [phone]

## 2022-01-06 NOTE — Discharge Instructions (Signed)
Please follow-up with the cardiology office and your primary care doctor.  Call their office to schedule an appointment.  Come back here if you have any chest pain, difficulty breathing, episodes of palpitations or other new concerning symptom.

## 2022-01-06 NOTE — ED Triage Notes (Addendum)
Patient arrived with EMS from home woke up this morning with anxiety attack and intermittent SVT resolved at arrival , patient articulated multiple stressors these past several days ( deaths in family) . She received NS IV 100 ml by EMS prior to arrival .

## 2022-01-09 ENCOUNTER — Ambulatory Visit
Admission: RE | Admit: 2022-01-09 | Discharge: 2022-01-09 | Disposition: A | Discharge status: Home / Self Care | Payer: 59 | Source: Ambulatory Visit | Attending: Otolaryngology | Admitting: Otolaryngology

## 2022-01-09 DIAGNOSIS — J329 Chronic sinusitis, unspecified: Secondary | ICD-10-CM

## 2022-01-09 DIAGNOSIS — M542 Cervicalgia: Secondary | ICD-10-CM

## 2022-01-09 MED ORDER — IOPAMIDOL (ISOVUE-300) INJECTION 61%
75.0000 mL | Freq: Once | INTRAVENOUS | Status: AC | PRN
  Administered 2022-01-09: 75 mL via INTRAVENOUS

## 2022-01-23 NOTE — Progress Notes (Unsigned)
Cardiology Office Note:    Date:  01/23/2022   ID:  Margaret Day, DOB 1956/05/07, MRN [id number]  PCP:  Gweneth Dimitri, MD   [facility] Providers Cardiologist:  None   Referring MD: Milagros Loll, MD    History of Present Illness:    Margaret Day is a 66 y.o. female with a hx of HLD and SVT who was referred by Dr. Stevie Kern for further evaluation of SVT.  Patient seen in [facility] ER on 01/06/22 for lightheadedness and presyncope found to have intermittent SVT on monitor by EMS. ECG on arrival to ER with NSR. Labs unremarkable. He felt better and was discharged home with CV follow-up.  Today, ***  Past Medical History:  Diagnosis Date   Allergy    seasonal   Hyperlipidemia    Palpitations    h/o, probably benign   Reflux    normal EGD 2009    Past Surgical History:  Procedure Laterality Date   CESAREAN SECTION  1992   COLONOSCOPY  06/2005   Dr. Sherin Quarry; 11/2014 Dr. Loreta Ave   ESOPHAGOGASTRODUODENOSCOPY  2009   unremarkable    Current Medications: No outpatient medications have been marked as taking for the 01/27/22 encounter (Appointment) with Meriam Sprague, MD.     Allergies:   Aspirin, Dairy aid [tilactase], Gluten meal, Penicillins, and Sulfa antibiotics   Social History   Socioeconomic History   Marital status: Married    Spouse name: Not on file   Number of children: Not on file   Years of education: Not on file   Highest education level: Not on file  Occupational History   Not on file  Tobacco Use   Smoking status: Never   Smokeless tobacco: Never  Substance and Sexual Activity   Alcohol use: Yes    Alcohol/week: 0.0 standard drinks of alcohol    Comment: rare, small amount of wine   Drug use: No   Sexual activity: Not Currently    Partners: Male  Other Topics Concern   Not on file  Social History Narrative   Degree in plant breeding genetics.  Hasn't worked since 1997, other than p/t Lawyer.   Homeschooled her children, not working outside the home. Lives at home with husband, 2 sons, 1 daughter. 1 dog.   Daughter Margaret Day is at school at [employer] graduated from [employer] is attending [employer]   Social Determinants of Health   Financial Resource Strain: Not on file  Food Insecurity: Not on file  Transportation Needs: Not on file  Physical Activity: Not on file  Stress: Not on file  Social Connections: Not on file     Family History: The patient's ***family history includes Autism in her son and son; Cancer in her maternal grandmother and mother; Colon cancer in her maternal grandmother; Colon polyps in her mother; Diabetes in her father; Down syndrome in her brother; GER disease in her brother; Healthy in her brother; Heart disease in her father; Hyperlipidemia in her mother; Hypertension in her mother; Urolithiasis in her sister.  ROS:   Please see the history of present illness.    *** All other systems reviewed and are negative.  EKGs/Labs/Other Studies Reviewed:    The following studies were reviewed today: ***  EKG:  EKG is *** ordered today.  The ekg ordered today demonstrates ***  Recent Labs: 01/06/2022: BUN 21; Creatinine, Ser 0.78; Hemoglobin 14.8; Magnesium 2.1; Platelets 277; Potassium 3.6;  Sodium 141  Recent Lipid Panel    Component Value Date/Time   CHOL 296 (H) 11/11/2016 1537   TRIG 139 11/11/2016 1537   HDL 73 11/11/2016 1537   CHOLHDL 4.1 11/11/2016 1537   VLDL 28 11/11/2016 1537   LDLCALC 195 (H) 11/11/2016 1537     Risk Assessment/Calculations:   {Does this patient have ATRIAL FIBRILLATION?:[id number]}       Physical Exam:    VS:  LMP 07/20/2006     Wt Readings from Last 3 Encounters:  03/23/18 153 lb (69.4 kg)  12/08/17 154 lb 6.4 oz (70 kg)  12/17/16 148 lb (67.1 kg)     GEN: *** Well nourished, well developed in no acute distress HEENT: Normal NECK: No JVD; No carotid bruits LYMPHATICS: No  lymphadenopathy CARDIAC: ***RRR, no murmurs, rubs, gallops RESPIRATORY:  Clear to auscultation without rales, wheezing or rhonchi  ABDOMEN: Soft, non-tender, non-distended MUSCULOSKELETAL:  No edema; No deformity  SKIN: Warm and dry NEUROLOGIC:  Alert and oriented x 3 PSYCHIATRIC:  Normal affect   ASSESSMENT:    No diagnosis found. PLAN:    In order of problems listed above:  #SVT: Patient with episode of lightheadedness and syncope found to have intermittent SVT on monitor for EMS. Work-up in ER reassuring. Will check cardiac monitor for further evaluation. -Check 2 week zio -Check TSH -? BB      {Are you ordering a CV Procedure (e.g. stress test, cath, DCCV, TEE, etc)?   Press F2        :[id number]}    Medication Adjustments/Labs and Tests Ordered: Current medicines are reviewed at length with the patient today.  Concerns regarding medicines are outlined above.  No orders of the defined types were placed in this encounter.  No orders of the defined types were placed in this encounter.   There are no Patient Instructions on file for this visit.   Signed, Meriam Sprague, MD  01/23/2022 7:10 AM    [facility]

## 2022-01-26 ENCOUNTER — Other Ambulatory Visit: Payer: Self-pay

## 2022-01-26 ENCOUNTER — Emergency Department (HOSPITAL_COMMUNITY)
Admission: EM | Admit: 2022-01-26 | Discharge: 2022-01-27 | Disposition: A | Discharge status: Home / Self Care | Payer: 59 | Attending: Emergency Medicine | Admitting: Emergency Medicine

## 2022-01-26 ENCOUNTER — Emergency Department (HOSPITAL_COMMUNITY): Payer: 59

## 2022-01-26 DIAGNOSIS — R002 Palpitations: Secondary | ICD-10-CM

## 2022-01-26 DIAGNOSIS — R531 Weakness: Secondary | ICD-10-CM | POA: Insufficient documentation

## 2022-01-26 HISTORY — DX: Supraventricular tachycardia, unspecified: I47.10

## 2022-01-26 HISTORY — DX: Supraventricular tachycardia: I47.1

## 2022-01-26 NOTE — ED Provider Triage Note (Signed)
Emergency Medicine Provider Triage Evaluation Note  Margaret Day , a 66 y.o. female  was evaluated in triage.  Pt complains of heart racing, fatigue, change in breathing onset 10pm, similar to how she felt when she was in the ER 6/20 with SVT. Is scheduled to see cards tomorrow, not currently on b blocker. History of palpitations since her 3rd pregnancy.   Review of Systems  Positive: As above Negative: CP  Physical Exam  BP (!) 145/83 (BP Location: Left Arm)   Pulse 100   Temp 97.7 F (36.5 C) (Oral)   Resp 16   LMP 07/20/2006   SpO2 98%  Gen:   Awake, no distress   Resp:  Normal effort  MSK:   Moves extremities without difficulty  Other:  Rate currently in the 90s on monitor   Medical Decision Making  Medically screening exam initiated at 11:22 PM.  Appropriate orders placed.  Jean Rosenthal was informed that the remainder of the evaluation will be completed by another provider, this initial triage assessment does not replace that evaluation, and the importance of remaining in the ED until their evaluation is complete.     Jeannie Fend, PA-C 01/26/22 2323

## 2022-01-26 NOTE — ED Triage Notes (Signed)
Pt reports with palpitations and SHOB from earlier today. Pt reports being diagnosed with SVT before.

## 2022-01-27 ENCOUNTER — Ambulatory Visit (INDEPENDENT_AMBULATORY_CARE_PROVIDER_SITE_OTHER): Payer: 59

## 2022-01-27 ENCOUNTER — Encounter: Payer: Self-pay | Admitting: Cardiology

## 2022-01-27 ENCOUNTER — Ambulatory Visit: Payer: 59 | Admitting: Cardiology

## 2022-01-27 VITALS — BP 110/70 | HR 96 | Ht 62.0 in | Wt 148.0 lb

## 2022-01-27 DIAGNOSIS — I471 Supraventricular tachycardia: Secondary | ICD-10-CM

## 2022-01-27 LAB — BASIC METABOLIC PANEL
Anion gap: 9 (ref 5–15)
BUN: 26 mg/dL — ABNORMAL HIGH (ref 8–23)
CO2: 22 mmol/L (ref 22–32)
Calcium: 10.3 mg/dL (ref 8.9–10.3)
Chloride: 112 mmol/L — ABNORMAL HIGH (ref 98–111)
Creatinine, Ser: 0.81 mg/dL (ref 0.44–1.00)
GFR, Estimated: >60 mL/min (ref >60)
Glucose, Bld: 113 mg/dL — ABNORMAL HIGH (ref 70–99)
Potassium: 4.1 mmol/L (ref 3.5–5.1)
Sodium: 143 mmol/L (ref 135–145)

## 2022-01-27 LAB — TSH: TSH: 3.913 u[IU]/mL (ref 0.350–4.500)

## 2022-01-27 LAB — MAGNESIUM: Magnesium: 2.6 mg/dL — ABNORMAL HIGH (ref 1.7–2.4)

## 2022-01-27 LAB — CBC
HCT: 43.4 % (ref 36.0–46.0)
Hemoglobin: 15 g/dL (ref 12.0–15.0)
MCH: 31.8 pg (ref 26.0–34.0)
MCHC: 34.6 g/dL (ref 30.0–36.0)
MCV: 91.9 fL (ref 80.0–100.0)
Platelets: 311 10*3/uL (ref 150–400)
RBC: 4.72 MIL/uL (ref 3.87–5.11)
RDW: 12.2 % (ref 11.5–15.5)
WBC: 8.6 10*3/uL (ref 4.0–10.5)
nRBC: 0 % (ref 0.0–0.2)

## 2022-01-27 LAB — TROPONIN I (HIGH SENSITIVITY): Troponin I (High Sensitivity): 8 ng/L (ref <18)

## 2022-01-27 MED ORDER — METOPROLOL TARTRATE 25 MG PO TABS: 12.5000 mg | ORAL_TABLET | Freq: Two times a day (BID) | ORAL | 3 refills | Status: DC

## 2022-01-27 NOTE — ED Provider Notes (Signed)
[facility]-EMERGENCY DEPT Provider Note   CSN: [id number] Arrival date & time: 01/26/22  2309     History  Chief Complaint  Patient presents with   Palpitations   Weakness    Margaret Day is a 66 y.o. female.  The history is provided by medical records and the patient.  Palpitations Associated symptoms: weakness   Weakness MALAIAH Day is a 66 y.o. female who presents to the Emergency Department complaining of palpitations.  She presents to the emergency department for an episode of palpitations that woke her from sleep at 11 PM.  She reports a racing heart rate for about 10 to 15 minutes.  She had a similar episode in June and was evaluated in the emergency department and diagnosed with SVT.  She is scheduled to see cardiology later today.  She did have transient shortness of breath-now resolved.  She also felt generalized weakness with this that also got better.  No fever but she did feel hot this afternoon.  No chest pain.  No leg swelling.  No history of DVT/PE.  She takes no medications.      Drinks 1 cup of coffee in the morning.  No OTC decongestants.      Home Medications Prior to Admission medications   Medication Sig Start Date End Date Taking? Authorizing Provider  b complex vitamins tablet Take 1 tablet by mouth daily. Reported on 09/02/2015    [provider]  Cholecalciferol (VITAMIN D) 1000 UNITS capsule Take 1,000 Units by mouth daily. Reported on 09/02/2015    [provider]  Coenzyme Q10 (CO Q 10 PO) Take 50 mg by mouth daily. Reported on 09/02/2015    [provider]  fexofenadine (ALLEGRA) 180 MG tablet Take 180 mg by mouth daily. Reported on 09/02/2015    [provider]  Pseudoephedrine HCl (SUDAFED PO) Take by mouth.    [provider]  vitamin C (ASCORBIC ACID) 500 MG tablet Take 500 mg by mouth daily.    [provider]  vitamin E 100 UNIT capsule Take 100 Units  by mouth daily. Reported on 09/02/2015    [provider]      Allergies    Aspirin, Dairy aid [tilactase], Gluten meal, Penicillins, and Sulfa antibiotics    Review of Systems   Review of Systems  Cardiovascular:  Positive for palpitations.  Neurological:  Positive for weakness.  All other systems reviewed and are negative.   Physical Exam Updated Vital Signs BP 131/77   Pulse 98   Temp 97.7 F (36.5 C) (Oral)   Resp 14   LMP 07/20/2006   SpO2 91%  Physical Exam Vitals and nursing note reviewed.  Constitutional:      Appearance: She is well-developed.  HENT:     Head: Normocephalic and atraumatic.  Cardiovascular:     Rate and Rhythm: Normal rate and regular rhythm.     Heart sounds: No murmur heard. Pulmonary:     Effort: Pulmonary effort is normal. No respiratory distress.     Breath sounds: Normal breath sounds.  Abdominal:     Palpations: Abdomen is soft.     Tenderness: There is no abdominal tenderness. There is no guarding or rebound.  Musculoskeletal:        General: No tenderness.  Skin:    General: Skin is warm and dry.  Neurological:     Mental Status: She is alert and oriented to person, place, and time.  Psychiatric:  Behavior: Behavior normal.     ED Results / Procedures / Treatments   Labs (all labs ordered are listed, but only abnormal results are displayed) Labs Reviewed  BASIC METABOLIC PANEL - Abnormal; Notable for the following components:      Result Value   Chloride 112 (*)    Glucose, Bld 113 (*)    BUN 26 (*)    All other components within normal limits  MAGNESIUM - Abnormal; Notable for the following components:   Magnesium 2.6 (*)    All other components within normal limits  CBC  TSH  TROPONIN I (HIGH SENSITIVITY)  TROPONIN I (HIGH SENSITIVITY)    EKG EKG Interpretation  Date/Time:  Tuesday January 27 2022 00:57:01 EDT Ventricular Rate:  96 PR Interval:  191 QRS Duration: 98 QT Interval:  357 QTC  Calculation: 452 R Axis:   7 Text Interpretation: Sinus rhythm Consider left atrial enlargement Abnormal R-wave progression, early transition Confirmed by Tilden Fossa [phone]) on 01/27/2022 1:12:33 AM  Radiology DG Chest Port 1 View  Result Date: 01/26/2022 CLINICAL DATA:  Shortness of breath.  Heart palpitations. EXAM: PORTABLE CHEST 1 VIEW COMPARISON:  None Available. FINDINGS: Low lung volumes. Gaseous gastric distension with associated elevation of left hemidiaphragm. Heart size normal for technique. Aortic tortuosity. Bronchovascular crowding related to low lung volumes. No large airspace disease. No pneumothorax or significant pleural effusion. IMPRESSION: 1. Low lung volumes with bronchovascular crowding. 2. Gaseous gastric distension with associated elevation of left hemidiaphragm. Electronically Signed   By: Narda Rutherford M.D.   On: 01/26/2022 23:55    Procedures Procedures    Medications Ordered in ED Medications - No data to display  ED Course/ Medical Decision Making/ A&P                           Medical Decision Making  Patient here for evaluation of palpitations that lasted 10 to 15 minutes at home.  She is asymptomatic at time of ED presentation.  EKG in the emergency department today with sinus rhythm.  Labs significant for mild elevation in BUN-patient reports drinking fluids well but did not drink as much water as usual this morning.  Chest x-ray with gaseous distention of the stomach, images personally reviewed and interpreted.  Patient without any abdominal pain, early satiety, nausea, vomiting.  Discussed with patient incidental finding on imaging-recommend PCP follow-up for recheck.  Records reviewed from recent ED visit as well as EMS run sheet.  Prehospital EKG with A-fib versus SVT.  At this point in time feel patient is stable for cardiology follow-up later today with return precautions.  Current clinical picture is not consistent with ACS, PE, pneumonia,  SBO.        Final Clinical Impression(s) / ED Diagnoses Final diagnoses:  Palpitations    Rx / DC Orders ED Discharge Orders     None         Tilden Fossa, MD 01/27/22 0202

## 2022-01-27 NOTE — Progress Notes (Unsigned)
Enrolled for [employer] to mail a ZIO XT long term holter monitor to the patients address on file.  

## 2022-01-27 NOTE — Progress Notes (Signed)
Cardiology Office Note:    Date:  01/27/2022   ID:  Margaret Day, DOB [birth year], MRN [id number]  PCP:  Gweneth Dimitri, MD   [facility] Providers Cardiologist:  None   Referring MD: Milagros Loll, MD    History of Present Illness:    Margaret Day is a 66 y.o. female with a hx of HLD and SVT who was referred by Dr. Stevie Kern for further evaluation of SVT.  Patient seen in [facility] ER on 01/06/22 for lightheadedness and presyncope found to have intermittent SVT on monitor by EMS. ECG on arrival to ER with NSR. Labs unremarkable. He felt better and was discharged home with CV follow-up.  Today, she is accompanied by her husband. She states that she has suffered from palpitations since her last pregnancy. 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 Symptoms would usually resolve with deep breathing or drinking cold water. Over the past couple of years, however, her symptoms became more frequent and bothersome. On 01/06/22 she had a bad episode where she woke up from sleep due to a foot cramp and suddenly felt weak and like she would fall if she moved. She called EMS and was determined to be in SVT as detailed above. Last night, she again returned to [facility] ER for palpitations and SOB thought to be due to recurrent SVT although she was back in NSR on evaluation in the ER.   She is otherwise doing okay. Has been suffering with sinus issues for which she is following with ENT.  Past Medical History:  Diagnosis Date   Allergy    seasonal   Hyperlipidemia    Palpitations    h/o, probably benign   Reflux    normal EGD 2009   SVT (supraventricular tachycardia) [facility])     Past Surgical History:  Procedure Laterality Date   CESAREAN SECTION  1992   COLONOSCOPY  06/2005   Dr. Sherin Quarry; 11/2014 Dr. Loreta Ave   ESOPHAGOGASTRODUODENOSCOPY  2009   unremarkable    Current Medications: Current Meds  Medication Sig    b complex vitamins tablet Take 1 tablet by mouth as needed. Reported on 09/02/2015   calcium carbonate (TUMS - DOSED IN MG ELEMENTAL CALCIUM) 500 MG chewable tablet Chew 1 tablet by mouth as needed for indigestion or heartburn.   Calcium Carbonate Antacid 400 MG CHEW Chew by mouth. Per patient taking 400 mg tablets daily   Cholecalciferol (VITAMIN D) 1000 UNITS capsule Take 1,000 Units by mouth daily. Reported on 09/02/2015   Coenzyme Q10 (CO Q 10 PO) Take 50 mg by mouth daily. Reported on 09/02/2015   Magnesium 200 MG TABS Take by mouth. Per patient taking 400 mg daily   metoprolol tartrate (LOPRESSOR) 25 MG tablet Take 0.5 tablets (12.5 mg total) by mouth 2 (two) times daily.   NASAL SALINE NA Place into the nose daily.   Omega-3 Fatty Acids (OMEGA 3 500 PO) Take by mouth in the morning and at bedtime.   vitamin C (ASCORBIC ACID) 500 MG tablet Take 500 mg by mouth daily.   vitamin E 100 UNIT capsule Take 100 Units by mouth as needed. Reported on 09/02/2015     Allergies:   Aspirin, Dairy aid [tilactase], Gluten meal, Penicillins, Sulfa antibiotics, and Zinc trace metal additives [zinc]   Social History   Socioeconomic History   Marital status: Married    Spouse name: Not on file   Number of children:  Not on file   Years of education: Not on file   Highest education level: Not on file  Occupational History   Not on file  Tobacco Use   Smoking status: Never   Smokeless tobacco: Never  Substance and Sexual Activity   Alcohol use: Yes    Alcohol/week: 0.0 standard drinks of alcohol    Comment: rare, small amount of wine   Drug use: No   Sexual activity: Not Currently    Partners: Male  Other Topics Concern   Not on file  Social History Narrative   Degree in plant breeding genetics.  Hasn't worked since 1997, other than p/t Lawyer.  Homeschooled her children, not working outside the home. Lives at home with husband, 2 sons, 1 daughter. 1 dog.   Daughter [name] is at school  at [employer] graduated from [employer] is attending [employer]   Social Determinants of Health   Financial Resource Strain: Not on file  Food Insecurity: Not on file  Transportation Needs: Not on file  Physical Activity: Not on file  Stress: Not on file  Social Connections: Not on file     Family History: The patient's family history includes Autism in her son and son; Cancer in her maternal grandmother and mother; Colon cancer in her maternal grandmother; Colon polyps in her mother; Diabetes in her father; Down syndrome in her brother; GER disease in her brother; Healthy in her brother; Heart disease in her father; Hyperlipidemia in her mother; Hypertension in her mother; Urolithiasis in her sister.  ROS:   Please see the history of present illness.     All other systems reviewed and are negative.  EKGs/Labs/Other Studies Reviewed:    The following studies were reviewed today: No prior cardiovascular studies available.   EKG:  EKG is personally reviewed.  01/27/2022 EKG: Rate 96. NSR   Recent Labs: 01/26/2022: BUN 26; Creatinine, Ser 0.81; Hemoglobin 15.0; Magnesium 2.6; Platelets 311; Potassium 4.1; Sodium 143; TSH 3.913  Recent Lipid Panel    Component Value Date/Time   CHOL 296 (H) 11/11/2016 1537   TRIG 139 11/11/2016 1537   HDL 73 11/11/2016 1537   CHOLHDL 4.1 11/11/2016 1537   VLDL 28 11/11/2016 1537   LDLCALC 195 (H) 11/11/2016 1537     Risk Assessment/Calculations:           Physical Exam:    VS:  BP 110/70   Pulse 96   Ht 5\' 2"  (1.575 m)   Wt 148 lb (67.1 kg)   LMP 07/20/2006   SpO2 96%   BMI 27.07 kg/m     Wt Readings from Last 3 Encounters:  01/27/22 148 lb (67.1 kg)  03/23/18 153 lb (69.4 kg)  12/08/17 154 lb 6.4 oz (70 kg)     GEN:  Well nourished, well developed in no acute distress HEENT: Normal NECK: No JVD; No carotid bruits CARDIAC: RRR, no murmurs, rubs, gallops RESPIRATORY:  Clear to auscultation without  rales, wheezing or rhonchi  ABDOMEN: Soft, non-tender, non-distended MUSCULOSKELETAL:  No edema; No deformity  SKIN: Warm and dry NEUROLOGIC:  Alert and oriented x 3 PSYCHIATRIC:  Normal affect   ASSESSMENT:    1. SVT (supraventricular tachycardia) (HCC)    PLAN:    In order of problems listed above:  #SVT: Patient with episode of lightheadedness and syncope found to have intermittent SVT on monitor for EMS. Work-up in ER reassuring. Will check cardiac monitor for further  evaluation and start metop 12.5mg  BID. Discussed vagal maneuvers at length today as well.  -Check 7 day zio -Check TTE -Start metop 12.5mg  BID -If true SVT, patient is interested in ablation as she does not like to take medications         Follow up in 6 months  Medication Adjustments/Labs and Tests Ordered: Current medicines are reviewed at length with the patient today.  Concerns regarding medicines are outlined above.  Orders Placed This Encounter  Procedures   LONG TERM MONITOR (3-14 DAYS)   ECHOCARDIOGRAM COMPLETE   Meds ordered this encounter  Medications   metoprolol tartrate (LOPRESSOR) 25 MG tablet    Sig: Take 0.5 tablets (12.5 mg total) by mouth 2 (two) times daily.    Dispense:  90 tablet    Refill:  3    Patient Instructions  Medication Instructions:   START TAKING METOPROLOL TARTRATE 12.5 MG BY MOUTH TWICE DAILY  *If you need a refill on your cardiac medications before your next appointment, please call your pharmacy*   Testing/Procedures:  Your physician has requested that you have an echocardiogram. Echocardiography is a painless test that uses sound waves to create images of your heart. It provides your doctor with information about the size and shape of your heart and how well your heart's chambers and valves are working. This procedure takes approximately one hour. There are no restrictions for this procedure.   ZIO XT- Long Term Monitor Instructions  Your physician has  requested you wear a ZIO patch monitor for 7 days.  This is a single patch monitor. [employer] supplies one patch monitor per enrollment. Additional stickers are not available. Please do not apply patch if you will be having a Nuclear Stress Test,  Echocardiogram, Cardiac CT, MRI, or Chest Xray during the period you would be wearing the  monitor. The patch cannot be worn during these tests. You cannot remove and re-apply the  ZIO XT patch monitor.  Your ZIO patch monitor will be mailed 3 day USPS to your address on file. It may take 3-5 days  to receive your monitor after you have been enrolled.  Once you have received your monitor, please review the enclosed instructions. Your monitor  has already been registered assigning a specific monitor serial # to you.  Billing and Patient Assistance Program Information  We have supplied [employer] with any of your insurance information on file for billing purposes. [employer] offers a sliding scale Patient Assistance Program for patients that do not have  insurance, or whose insurance does not completely cover the cost of the ZIO monitor.  You must apply for the Patient Assistance Program to qualify for this discounted rate.  To apply, please call [employer] at [phone], select option 4, select option 2, ask to apply for  Patient Assistance Program. Meredeth Ide will ask your household income, and how many people  are in your household. They will quote your out-of-pocket cost based on that information.  [employer] will also be able to set up a 87-month, interest-free payment plan if needed.  Applying the monitor   Shave hair from upper left chest.  Hold abrader disc by orange tab. Rub abrader in 40 strokes over the upper left chest as  indicated in your monitor instructions.  Clean area with 4 enclosed alcohol pads. Let dry.  Apply patch as indicated in monitor instructions. Patch will be placed under collarbone on left  side of chest with arrow pointing upward.   Rub patch adhesive wings  for 2 minutes. Remove white label marked "1". Remove the white  label marked "2". Rub patch adhesive wings for 2 additional minutes.  While looking in a mirror, press and release button in center of patch. A small green light will  flash 3-4 times. This will be your only indicator that the monitor has been turned on.  Do not shower for the first 24 hours. You may shower after the first 24 hours.  Press the button if you feel a symptom. You will hear a small click. Record Date, Time and  Symptom in the Patient Logbook.  When you are ready to remove the patch, follow instructions on the last 2 pages of Patient  Logbook. Stick patch monitor onto the last page of Patient Logbook.  Place Patient Logbook in the blue and white box. Use locking tab on box and tape box closed  securely. The blue and white box has prepaid postage on it. Please place it in the mailbox as  soon as possible. Your physician should have your test results approximately 7 days after the  monitor has been mailed back to [facility].  Call [facility] Customer Care at [phone] if you have questions regarding  your ZIO XT patch monitor. Call them immediately if you see an orange light blinking on your  monitor.  If your monitor falls off in less than 4 days, contact our Monitor department at [phone].  If your monitor becomes loose or falls off after 4 days call [employer] at [phone] for  suggestions on securing your monitor    Follow-Up: At [facility], you and your health needs are our priority.  As part of our continuing mission to provide you with exceptional heart care, we have created designated Provider Care Teams.  These Care Teams include your primary Cardiologist (physician) and Advanced Practice Providers (APPs -  Physician Assistants and Nurse Practitioners) who all work together to provide you with the care you need, when you need it.  We recommend signing up for the  patient portal called "MyChart".  Sign up information is provided on this After Visit Summary.  MyChart is used to connect with patients for Virtual Visits (Telemedicine).  Patients are able to view lab/test results, encounter notes, upcoming appointments, etc.  Non-urgent messages can be sent to your provider as well.   To learn more about what you can do with MyChart, go to [email or web address].    Your next appointment:   6 month(s)  The format for your next appointment:   In Person  Provider:   DR. Shari Prows OR AN EXTENDER   Important Information About Sugar          I,Tinashe Williams,acting as a scribe for Meriam Sprague, MD.,have documented all relevant documentation on the behalf of Meriam Sprague, MD,as directed by  Meriam Sprague, MD while in the presence of Meriam Sprague, MD.   I, Meriam Sprague, MD, have reviewed all documentation for this visit. The documentation on 01/27/22 for the exam, diagnosis, procedures, and orders are all accurate and complete.

## 2022-01-27 NOTE — Patient Instructions (Signed)
Medication Instructions:   START TAKING METOPROLOL TARTRATE 12.5 MG BY MOUTH TWICE DAILY  *If you need a refill on your cardiac medications before your next appointment, please call your pharmacy*   Testing/Procedures:  Your physician has requested that you have an echocardiogram. Echocardiography is a painless test that uses sound waves to create images of your heart. It provides your doctor with information about the size and shape of your heart and how well your heart's chambers and valves are working. This procedure takes approximately one hour. There are no restrictions for this procedure.   ZIO XT- Long Term Monitor Instructions  Your physician has requested you wear a ZIO patch monitor for 7 days.  This is a single patch monitor. [employer] supplies one patch monitor per enrollment. Additional stickers are not available. Please do not apply patch if you will be having a Nuclear Stress Test,  Echocardiogram, Cardiac CT, MRI, or Chest Xray during the period you would be wearing the  monitor. The patch cannot be worn during these tests. You cannot remove and re-apply the  ZIO XT patch monitor.  Your ZIO patch monitor will be mailed 3 day USPS to your address on file. It may take 3-5 days  to receive your monitor after you have been enrolled.  Once you have received your monitor, please review the enclosed instructions. Your monitor  has already been registered assigning a specific monitor serial # to you.  Billing and Patient Assistance Program Information  We have supplied [employer] with any of your insurance information on file for billing purposes. [employer] offers a sliding scale Patient Assistance Program for patients that do not have  insurance, or whose insurance does not completely cover the cost of the ZIO monitor.  You must apply for the Patient Assistance Program to qualify for this discounted rate.  To apply, please call [employer] at [phone], select option 4, select option  2, ask to apply for  Patient Assistance Program. Meredeth Ide will ask your household income, and how many people  are in your household. They will quote your out-of-pocket cost based on that information.  [employer] will also be able to set up a 54-month, interest-free payment plan if needed.  Applying the monitor   Shave hair from upper left chest.  Hold abrader disc by orange tab. Rub abrader in 40 strokes over the upper left chest as  indicated in your monitor instructions.  Clean area with 4 enclosed alcohol pads. Let dry.  Apply patch as indicated in monitor instructions. Patch will be placed under collarbone on left  side of chest with arrow pointing upward.  Rub patch adhesive wings for 2 minutes. Remove white label marked "1". Remove the white  label marked "2". Rub patch adhesive wings for 2 additional minutes.  While looking in a mirror, press and release button in center of patch. A small green light will  flash 3-4 times. This will be your only indicator that the monitor has been turned on.  Do not shower for the first 24 hours. You may shower after the first 24 hours.  Press the button if you feel a symptom. You will hear a small click. Record Date, Time and  Symptom in the Patient Logbook.  When you are ready to remove the patch, follow instructions on the last 2 pages of Patient  Logbook. Stick patch monitor onto the last page of Patient Logbook.  Place Patient Logbook in the blue and white box. Use locking tab on box and  tape box closed  securely. The blue and white box has prepaid postage on it. Please place it in the mailbox as  soon as possible. Your physician should have your test results approximately 7 days after the  monitor has been mailed back to [facility].  Call [facility] Customer Care at [phone] if you have questions regarding  your ZIO XT patch monitor. Call them immediately if you see an orange light blinking on your  monitor.  If your monitor falls  off in less than 4 days, contact our Monitor department at [phone].  If your monitor becomes loose or falls off after 4 days call [employer] at [phone] for  suggestions on securing your monitor    Follow-Up: At [facility], you and your health needs are our priority.  As part of our continuing mission to provide you with exceptional heart care, we have created designated Provider Care Teams.  These Care Teams include your primary Cardiologist (physician) and Advanced Practice Providers (APPs -  Physician Assistants and Nurse Practitioners) who all work together to provide you with the care you need, when you need it.  We recommend signing up for the patient portal called "MyChart".  Sign up information is provided on this After Visit Summary.  MyChart is used to connect with patients for Virtual Visits (Telemedicine).  Patients are able to view lab/test results, encounter notes, upcoming appointments, etc.  Non-urgent messages can be sent to your provider as well.   To learn more about what you can do with MyChart, go to [email or web address].    Your next appointment:   6 month(s)  The format for your next appointment:   In Person  Provider:   DR. Shari Prows OR AN EXTENDER   Important Information About Sugar

## 2022-01-27 NOTE — ED Notes (Signed)
Urine sample in triage nursing station.

## 2022-01-27 NOTE — Discharge Instructions (Signed)
You had labs performed in the emergency department today as well as an x-ray.  The x-ray showed that your stomach is mildly distended and your BUN is mildly elevated.  Be sure to drink plenty of fluids.  Please follow-up with your family doctor for repeat evaluation.  Get rechecked sooner if you have new or concerning symptoms.    Please follow-up with cardiology later today regarding your palpitations.

## 2022-01-29 DIAGNOSIS — I471 Supraventricular tachycardia: Secondary | ICD-10-CM

## 2022-02-12 ENCOUNTER — Telehealth: Payer: Self-pay | Admitting: Cardiology

## 2022-02-12 NOTE — Telephone Encounter (Signed)
Patient is following up on this new medication (metoprolol tartrate (LOPRESSOR) 25 MG tablet) and is reporting that this medication is making her tired.  She would like to adjust the dosage to see if this would make a difference. Can leave a detailed message in her voice mail if call not answered.

## 2022-02-12 NOTE — Telephone Encounter (Signed)
Called patient and discussed Dr. Devin Going recommendations.  Per Dr. Shari Prows: Her heart monitor looks good with nothing concerning. Her symptoms correlated with PVCs which were rare and are not harmful. If she is not tolerating the metoprolol, she can absolutely stop it and see how she feels.   Patient verbalized understanding and asked if she still needs to come in for her echocardiogram. Explained to patient the echo will show Dr. Shari Prows how well her heart pumping function is and the structure of the heart chambers and valves, and it is recommended she keeps this appointment. Patient verbalized understanding and expressed appreciation for call.

## 2022-02-12 NOTE — Telephone Encounter (Signed)
Pt is returning call.  

## 2022-02-12 NOTE — Telephone Encounter (Signed)
Left message to call back  

## 2022-02-12 NOTE — Telephone Encounter (Signed)
Her heart monitor looks good with nothing concerning. Her symptoms correlated with PVCs which were rare and are not harmful. If she is not tolerating the metoprolol, she can absolutely stop it and see how she feels.

## 2022-02-12 NOTE — Telephone Encounter (Signed)
Pt aware monitor came back today and awaiting MD review. Aware RN  will call her after monitor review w/ recommendation. Patient verbalized understanding and agreeable to plan.

## 2022-02-17 ENCOUNTER — Ambulatory Visit (HOSPITAL_COMMUNITY): Payer: 59 | Attending: Cardiovascular Disease

## 2022-02-17 DIAGNOSIS — I471 Supraventricular tachycardia: Secondary | ICD-10-CM | POA: Diagnosis not present

## 2022-02-17 LAB — ECHOCARDIOGRAM COMPLETE
Area-P 1/2: 3.91 cm2
P 1/2 time: 453 msec
S' Lateral: 3.4 cm

## 2022-02-18 ENCOUNTER — Telehealth: Payer: Self-pay | Admitting: Cardiology

## 2022-02-18 NOTE — Telephone Encounter (Signed)
Left the pt a message to call the office back. 

## 2022-02-18 NOTE — Telephone Encounter (Signed)
Follow Up:      Patientis returning Ivy's call from today, concerning her Echo results.

## 2022-02-18 NOTE — Telephone Encounter (Signed)
2 yr echo recall placed.

## 2022-02-18 NOTE — Telephone Encounter (Signed)
-----   Message from Margaret Sprague, MD sent at 02/17/2022  8:11 PM EDT ----- Her echo shows normal pumping function. 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

## 2022-02-18 NOTE — Telephone Encounter (Signed)
The patient has been notified of the result and verbalized understanding.  All questions (if any) were answered. Loa Socks, LPN 09/25/7562 33:29 PM

## 2022-08-06 ENCOUNTER — Encounter: Payer: 59 | Admitting: Sports Medicine

## 2022-09-01 NOTE — Progress Notes (Signed)
Cardiology Office Note:    Date:  09/04/2022   ID:  Margaret Day, DOB [birth year], MRN [id number]  PCP:  Donald Prose, MD   [place] Providers Cardiologist:  None   Referring MD: Cari Caraway, MD    History of Present Illness:    Margaret Day is a 67 y.o. female with a hx of HLD and SVT who presents to clinic for follow-up.  Patient seen in [facility] ER on 01/06/22 for lightheadedness and presyncope found to have intermittent SVT on monitor by EMS. ECG on arrival to ER with NSR. Labs unremarkable. She felt better and was discharged home with CV follow-up.  Was initially seen in clinic on 01/2022 for follow-up of SVT. Cardiac monitor 01/2022 with 5 runs of nonsustained SVT, rare ectopy. TTE 02/2022 60-65%, normal RV, mild to moderate AR, trivial MR.  Today, the patient overall feels well. Has had occasional palpitations but not overly bothersome. No chest pain, SOB, orthopnea, or PND. No lightheadedness, dizziness or syncope. Has been suffering from left leg/knee pain and she has started PT. Did not tolerate metoprolol due to fatigue.  Past Medical History:  Diagnosis Date   Allergy    seasonal   Hyperlipidemia    Palpitations    h/o, probably benign   Reflux    normal EGD 2009   SVT (supraventricular tachycardia)     Past Surgical History:  Procedure Laterality Date   CESAREAN SECTION  1992   COLONOSCOPY  06/2005   Dr. Sammuel Cooper; 11/2014 Dr. Collene Mares   ESOPHAGOGASTRODUODENOSCOPY  2009   unremarkable    Current Medications: Current Meds  Medication Sig   b complex vitamins tablet Take 1 tablet by mouth as needed. Reported on 09/02/2015   calcium carbonate (TUMS - DOSED IN MG ELEMENTAL CALCIUM) 500 MG chewable tablet Chew 1 tablet by mouth as needed for indigestion or heartburn.   Calcium Carbonate Antacid 400 MG CHEW Chew by mouth. Per patient taking 400 mg tablets daily   Cholecalciferol (VITAMIN D) 1000 UNITS capsule Take 1,000 Units by mouth  daily. Reported on 09/02/2015   Coenzyme Q10 (CO Q 10 PO) Take 50 mg by mouth daily. Reported on 09/02/2015   Magnesium 200 MG TABS Take by mouth. Per patient taking 400 mg daily   metoprolol tartrate (LOPRESSOR) 25 MG tablet Take 0.5 tablets (12.5 mg total) by mouth 2 (two) times daily.   NASAL SALINE NA Place into the nose daily.   Omega-3 Fatty Acids (OMEGA 3 500 PO) Take by mouth in the morning and at bedtime.   vitamin C (ASCORBIC ACID) 500 MG tablet Take 500 mg by mouth daily.   vitamin E 100 UNIT capsule Take 100 Units by mouth as needed. Reported on 09/02/2015     Allergies:   Aspirin, Dairy aid [tilactase], Gluten meal, Penicillins, Sulfa antibiotics, and Zinc trace metal additives [zinc]   Social History   Socioeconomic History   Marital status: Married    Spouse name: Not on file   Number of children: Not on file   Years of education: Not on file   Highest education level: Not on file  Occupational History   Not on file  Tobacco Use   Smoking status: Never   Smokeless tobacco: Never  Substance and Sexual Activity   Alcohol use: Yes    Alcohol/week: 0.0 standard drinks of alcohol    Comment: rare, small amount of wine   Drug use: No   Sexual activity: Not Currently  Partners: Male  Other Topics Concern   Not on file  Social History Narrative   Degree in plant breeding genetics.  Hasn't worked since 1997, other than p/t Oceanographer.  Homeschooled her children, not working outside the home. Lives at home with husband, 2 sons, 1 daughter. 1 dog.   Daughter Estill Batten is at school at [employer] graduated from [employer] is attending [employer]   Social Determinants of Health   Financial Resource Strain: Not on file  Food Insecurity: Not on file  Transportation Needs: Not on file  Physical Activity: Not on file  Stress: Not on file  Social Connections: Not on file     Family History: The patient's family history includes Autism in her  son and son; Cancer in her maternal grandmother and mother; Colon cancer in her maternal grandmother; Colon polyps in her mother; Diabetes in her father; Down syndrome in her brother; GER disease in her brother; Healthy in her brother; Heart disease in her father; Hyperlipidemia in her mother; Hypertension in her mother; Urolithiasis in her sister.  ROS:   Please see the history of present illness.     All other systems reviewed and are negative.  EKGs/Labs/Other Studies Reviewed:    The following studies were reviewed today: TTE 03-04-22: IMPRESSIONS   1. Left ventricular ejection fraction, by estimation, is 60 to 65%. The  left ventricle has normal function. The left ventricle has no regional  wall motion abnormalities. Left ventricular diastolic parameters were  normal.   2. Right ventricular systolic function is normal. The right ventricular  size is normal. Tricuspid regurgitation signal is inadequate for assessing  PA pressure.   3. The mitral valve is normal in structure. Trivial mitral valve  regurgitation. No evidence of mitral stenosis.   4. The aortic valve is calcified. Aortic valve regurgitation is mild to  moderate. Aortic valve sclerosis is present, with no evidence of aortic  valve stenosis. Aortic regurgitation PHT measures 453 msec.   5. The inferior vena cava is normal in size with greater than 50%  respiratory variability, suggesting right atrial pressure of 3 mmHg.   Cardiac Monitor 01/2022:   Patch wear time was 6 days and 23 hours   Predominant rhythm was NSR with average HR 85bpm   There were 5 nonsustained runs of SVT with longest lasting 9.9seconds   Rare SVE (<1%), rare VE (<1%)   Patient triggered events correlate with PVCs   No sustained arrhythmias or significant pauses     Patch Wear Time:  6 days and 23 hours (2023-07-13T20:42:11-398 to 2023-07-20T20:16:25-0400)   Patient had a min HR of 58 bpm, max HR of 174 bpm, and avg HR of 85 bpm. Predominant  underlying rhythm was Sinus Rhythm. First Degree AV Block was present. 5 Supraventricular Tachycardia runs occurred, the run with the fastest interval lasting 9.9 secs  with a max rate of 174 bpm (avg 152 bpm); the run with the fastest interval was also the longest. Isolated SVEs were rare (<1.0%), SVE Couplets were rare (<1.0%), and SVE Triplets were rare (<1.0%). Isolated VEs were rare (<1.0%, 2728), VE Couplets were  rare (<1.0%, 554), and VE Triplets were rare (<1.0%, 7). Ventricular Bigeminy was present.  EKG:  EKG is personally reviewed.  01/27/2022 EKG: Rate 96. NSR   Recent Labs: 01/26/2022: BUN 26; Creatinine, Ser 0.81; Hemoglobin 15.0; Magnesium 2.6; Platelets 311; Potassium 4.1; Sodium 143; TSH 3.913  Recent Lipid Panel  Component Value Date/Time   CHOL 296 (H) 11/11/2016 1537   TRIG 139 11/11/2016 1537   HDL 73 11/11/2016 1537   CHOLHDL 4.1 11/11/2016 1537   VLDL 28 11/11/2016 1537   LDLCALC 195 (H) 11/11/2016 1537     Risk Assessment/Calculations:           Physical Exam:    VS:  Pulse 92   Ht 5' 2"$  (1.575 m)   Wt 151 lb 6.4 oz (68.7 kg)   LMP 07/20/2006   SpO2 96%   BMI 27.69 kg/m     Wt Readings from Last 3 Encounters:  09/04/22 151 lb 6.4 oz (68.7 kg)  01/27/22 148 lb (67.1 kg)  03/23/18 153 lb (69.4 kg)     GEN:  Well nourished, well developed in no acute distress HEENT: Normal NECK: No JVD; No carotid bruits CARDIAC: RRR, no murmurs, rubs, gallops RESPIRATORY:  Clear to auscultation without rales, wheezing or rhonchi  ABDOMEN: Soft, non-tender, non-distended MUSCULOSKELETAL:  No edema; No deformity  SKIN: Warm and dry NEUROLOGIC:  Alert and oriented x 3 PSYCHIATRIC:  Normal affect   ASSESSMENT:    1. SVT (supraventricular tachycardia)   2. Mild aortic regurgitation   3. Family history of early CAD    PLAN:    In order of problems listed above:  #SVT: Currently well controlled with few breakthrough episodes. Did not tolerate metop due  to fatigue but has been managing well with conservative management with hydration, stress relief, and exercise. TTE with normal BiV function and no significant valve disease.  -Off metop due to fatigue -Doing well with conservative management including hydration, stress relief and exercise  #Mild to Moderate AR: -Continue serial monitoring 2 years  #CV Risk Stratification: -Check Ca score        Follow up in 6 months  Medication Adjustments/Labs and Tests Ordered: Current medicines are reviewed at length with the patient today.  Concerns regarding medicines are outlined above.  Orders Placed This Encounter  Procedures   CT CARDIAC SCORING (SELF PAY ONLY)   No orders of the defined types were placed in this encounter.   Patient Instructions  Medication Instructions:   Your physician recommends that you continue on your current medications as directed. Please refer to the Current Medication list given to you today.  *If you need a refill on your cardiac medications before your next appointment, please call your pharmacy*   Testing/Procedures:  CARDIAC CALCIUM SCORE (SELF PAY)   Follow-Up: At [facility], you and your health needs are our priority.  As part of our continuing mission to provide you with exceptional heart care, we have created designated Provider Care Teams.  These Care Teams include your primary Cardiologist (physician) and Advanced Practice Providers (APPs -  Physician Assistants and Nurse Practitioners) who all work together to provide you with the care you need, when you need it.  We recommend signing up for the patient portal called "MyChart".  Sign up information is provided on this After Visit Summary.  MyChart is used to connect with patients for Virtual Visits (Telemedicine).  Patients are able to view lab/test results, encounter notes, upcoming appointments, etc.  Non-urgent messages can be sent to your provider as well.   To learn more about  what you can do with MyChart, go to [email or web address].    Your next appointment:   2 year(s)  Provider:   DR. Johney Frame

## 2022-09-04 ENCOUNTER — Encounter: Payer: Self-pay | Admitting: Cardiology

## 2022-09-04 ENCOUNTER — Ambulatory Visit: Payer: 59 | Attending: Cardiology | Admitting: Cardiology

## 2022-09-04 VITALS — HR 92 | Ht 62.0 in | Wt 151.4 lb

## 2022-09-04 DIAGNOSIS — Z8249 Family history of ischemic heart disease and other diseases of the circulatory system: Secondary | ICD-10-CM

## 2022-09-04 DIAGNOSIS — I471 Supraventricular tachycardia, unspecified: Secondary | ICD-10-CM

## 2022-09-04 DIAGNOSIS — I351 Nonrheumatic aortic (valve) insufficiency: Secondary | ICD-10-CM | POA: Diagnosis not present

## 2022-09-04 NOTE — Patient Instructions (Signed)
Medication Instructions:   Your physician recommends that you continue on your current medications as directed. Please refer to the Current Medication list given to you today.  *If you need a refill on your cardiac medications before your next appointment, please call your pharmacy*   Testing/Procedures:  CARDIAC CALCIUM SCORE (SELF PAY)   Follow-Up: At [facility], you and your health needs are our priority.  As part of our continuing mission to provide you with exceptional heart care, we have created designated Provider Care Teams.  These Care Teams include your primary Cardiologist (physician) and Advanced Practice Providers (APPs -  Physician Assistants and Nurse Practitioners) who all work together to provide you with the care you need, when you need it.  We recommend signing up for the patient portal called "MyChart".  Sign up information is provided on this After Visit Summary.  MyChart is used to connect with patients for Virtual Visits (Telemedicine).  Patients are able to view lab/test results, encounter notes, upcoming appointments, etc.  Non-urgent messages can be sent to your provider as well.   To learn more about what you can do with MyChart, go to [email or web address].    Your next appointment:   2 year(s)  Provider:   DR. Johney Frame

## 2023-01-15 ENCOUNTER — Other Ambulatory Visit: Payer: Self-pay

## 2023-01-15 ENCOUNTER — Emergency Department (HOSPITAL_COMMUNITY): Payer: 59

## 2023-01-15 ENCOUNTER — Inpatient Hospital Stay (HOSPITAL_COMMUNITY)
Admission: EM | Admit: 2023-01-15 | Discharge: 2023-01-20 | DRG: 493 | Disposition: A | Discharge status: Home Health | Payer: 59 | Attending: Internal Medicine | Admitting: Internal Medicine

## 2023-01-15 ENCOUNTER — Encounter (HOSPITAL_COMMUNITY): Payer: Self-pay | Admitting: Radiology

## 2023-01-15 DIAGNOSIS — Z880 Allergy status to penicillin: Secondary | ICD-10-CM

## 2023-01-15 DIAGNOSIS — W010XXA Fall on same level from slipping, tripping and stumbling without subsequent striking against object, initial encounter: Secondary | ICD-10-CM | POA: Diagnosis present

## 2023-01-15 DIAGNOSIS — Z9109 Other allergy status, other than to drugs and biological substances: Secondary | ICD-10-CM

## 2023-01-15 DIAGNOSIS — E871 Hypo-osmolality and hyponatremia: Secondary | ICD-10-CM | POA: Diagnosis not present

## 2023-01-15 DIAGNOSIS — W19XXXA Unspecified fall, initial encounter: Principal | ICD-10-CM

## 2023-01-15 DIAGNOSIS — Z780 Asymptomatic menopausal state: Secondary | ICD-10-CM

## 2023-01-15 DIAGNOSIS — Z888 Allergy status to other drugs, medicaments and biological substances status: Secondary | ICD-10-CM

## 2023-01-15 DIAGNOSIS — K219 Gastro-esophageal reflux disease without esophagitis: Secondary | ICD-10-CM | POA: Diagnosis present

## 2023-01-15 DIAGNOSIS — Z8279 Family history of other congenital malformations, deformations and chromosomal abnormalities: Secondary | ICD-10-CM

## 2023-01-15 DIAGNOSIS — Z1211 Encounter for screening for malignant neoplasm of colon: Secondary | ICD-10-CM

## 2023-01-15 DIAGNOSIS — Z886 Allergy status to analgesic agent status: Secondary | ICD-10-CM

## 2023-01-15 DIAGNOSIS — Z79899 Other long term (current) drug therapy: Secondary | ICD-10-CM

## 2023-01-15 DIAGNOSIS — Z882 Allergy status to sulfonamides status: Secondary | ICD-10-CM

## 2023-01-15 DIAGNOSIS — Z8249 Family history of ischemic heart disease and other diseases of the circulatory system: Secondary | ICD-10-CM

## 2023-01-15 DIAGNOSIS — M25062 Hemarthrosis, left knee: Secondary | ICD-10-CM | POA: Diagnosis present

## 2023-01-15 DIAGNOSIS — S82202A Unspecified fracture of shaft of left tibia, initial encounter for closed fracture: Secondary | ICD-10-CM

## 2023-01-15 DIAGNOSIS — E876 Hypokalemia: Secondary | ICD-10-CM | POA: Diagnosis present

## 2023-01-15 DIAGNOSIS — Z83719 Family history of colon polyps, unspecified: Secondary | ICD-10-CM

## 2023-01-15 DIAGNOSIS — S0010XA Contusion of unspecified eyelid and periocular area, initial encounter: Secondary | ICD-10-CM | POA: Diagnosis present

## 2023-01-15 DIAGNOSIS — S82102A Unspecified fracture of upper end of left tibia, initial encounter for closed fracture: Secondary | ICD-10-CM

## 2023-01-15 DIAGNOSIS — R002 Palpitations: Secondary | ICD-10-CM | POA: Diagnosis present

## 2023-01-15 DIAGNOSIS — Z800 Family history of malignant neoplasm of digestive organs: Secondary | ICD-10-CM

## 2023-01-15 DIAGNOSIS — Y92512 Supermarket, store or market as the place of occurrence of the external cause: Secondary | ICD-10-CM

## 2023-01-15 DIAGNOSIS — Z833 Family history of diabetes mellitus: Secondary | ICD-10-CM

## 2023-01-15 DIAGNOSIS — Z83438 Family history of other disorder of lipoprotein metabolism and other lipidemia: Secondary | ICD-10-CM

## 2023-01-15 DIAGNOSIS — I471 Supraventricular tachycardia, unspecified: Secondary | ICD-10-CM | POA: Diagnosis present

## 2023-01-15 DIAGNOSIS — E7800 Pure hypercholesterolemia, unspecified: Secondary | ICD-10-CM | POA: Diagnosis present

## 2023-01-15 DIAGNOSIS — S82142A Displaced bicondylar fracture of left tibia, initial encounter for closed fracture: Secondary | ICD-10-CM | POA: Diagnosis not present

## 2023-01-15 DIAGNOSIS — I351 Nonrheumatic aortic (valve) insufficiency: Secondary | ICD-10-CM | POA: Diagnosis present

## 2023-01-15 HISTORY — DX: Displaced bicondylar fracture of left tibia, initial encounter for closed fracture: S82.142A

## 2023-01-15 LAB — COMPREHENSIVE METABOLIC PANEL
ALT: 22 U/L (ref 0–44)
AST: 21 U/L (ref 15–41)
Albumin: 4 g/dL (ref 3.5–5.0)
Alkaline Phosphatase: 70 U/L (ref 38–126)
Anion gap: 10 (ref 5–15)
BUN: 12 mg/dL (ref 8–23)
CO2: 22 mmol/L (ref 22–32)
Calcium: 9.2 mg/dL (ref 8.9–10.3)
Chloride: 101 mmol/L (ref 98–111)
Creatinine, Ser: 0.56 mg/dL (ref 0.44–1.00)
GFR, Estimated: >60 mL/min (ref >60)
Glucose, Bld: 145 mg/dL — ABNORMAL HIGH (ref 70–99)
Potassium: 3.5 mmol/L (ref 3.5–5.1)
Sodium: 133 mmol/L — ABNORMAL LOW (ref 135–145)
Total Bilirubin: 0.8 mg/dL (ref 0.3–1.2)
Total Protein: 7.3 g/dL (ref 6.5–8.1)

## 2023-01-15 LAB — CBC
HCT: 41.5 % (ref 36.0–46.0)
Hemoglobin: 14.2 g/dL (ref 12.0–15.0)
MCH: 31.3 pg (ref 26.0–34.0)
MCHC: 34.2 g/dL (ref 30.0–36.0)
MCV: 91.4 fL (ref 80.0–100.0)
Platelets: 274 10*3/uL (ref 150–400)
RBC: 4.54 MIL/uL (ref 3.87–5.11)
RDW: 12.3 % (ref 11.5–15.5)
WBC: 13.7 10*3/uL — ABNORMAL HIGH (ref 4.0–10.5)
nRBC: 0 % (ref 0.0–0.2)

## 2023-01-15 MED ORDER — TRAMADOL HCL 50 MG PO TABS
25.0000 mg | ORAL_TABLET | Freq: Once | ORAL | Status: AC
  Administered 2023-01-15: 25 mg via ORAL
  Filled 2023-01-15: qty 1

## 2023-01-15 MED ORDER — METOPROLOL TARTRATE 12.5 MG HALF TABLET: 12.5000 mg | ORAL_TABLET | Freq: Two times a day (BID) | ORAL | Status: DC

## 2023-01-15 MED ORDER — ONDANSETRON HCL 4 MG PO TABS
4.0000 mg | ORAL_TABLET | Freq: Four times a day (QID) | ORAL | Status: DC | PRN
  Administered 2023-01-16 – 2023-01-20 (×6): 4 mg via ORAL
  Filled 2023-01-15 (×6): qty 1

## 2023-01-15 MED ORDER — ENOXAPARIN SODIUM 40 MG/0.4ML IJ SOSY: 40.0000 mg | PREFILLED_SYRINGE | Freq: Every day | INTRAMUSCULAR | Status: DC

## 2023-01-15 MED ORDER — ONDANSETRON HCL 4 MG/2ML IJ SOLN: 4.0000 mg | Freq: Four times a day (QID) | INTRAMUSCULAR | Status: DC | PRN

## 2023-01-15 MED ORDER — HYDROMORPHONE HCL 1 MG/ML IJ SOLN
0.5000 mg | INTRAMUSCULAR | Status: DC | PRN
  Administered 2023-01-18 (×2): 0.5 mg via INTRAVENOUS
  Filled 2023-01-15 (×2): qty 0.5

## 2023-01-15 MED ORDER — OXYCODONE HCL 5 MG PO TABS
5.0000 mg | ORAL_TABLET | ORAL | Status: DC | PRN
  Administered 2023-01-15 – 2023-01-18 (×10): 5 mg via ORAL
  Filled 2023-01-15 (×11): qty 1

## 2023-01-15 MED ORDER — ACETAMINOPHEN 325 MG PO TABS
650.0000 mg | ORAL_TABLET | Freq: Four times a day (QID) | ORAL | Status: DC | PRN
  Administered 2023-01-16: 650 mg via ORAL
  Filled 2023-01-15: qty 2

## 2023-01-15 MED ORDER — ACETAMINOPHEN 650 MG RE SUPP: 650.0000 mg | Freq: Four times a day (QID) | RECTAL | Status: DC | PRN

## 2023-01-15 NOTE — ED Notes (Signed)
ED TO INPATIENT HANDOFF REPORT  ED Nurse Name and Phone #: Arnetha Gula Name/Age/Gender Margaret Day 67 y.o. female Room/Bed: WHALB/WHALB  Code Status   Code Status: Full Code  Home/SNF/Other Home Patient oriented to: self, place, time, and situation Is this baseline? Yes   Triage Complete: Triage complete  Chief Complaint Left tibial fracture [S82.202A]  Triage Note Pt tripped and fell at the entrance of [employer]. She caught herself on her knee and hit her head on the glass door. She has pain in her right shoulder and left knee. She also has a swelling on her head.   BP 154/82 HR 86 RR 20 O2 100   Allergies Allergies  Allergen Reactions   Aspirin Other (See Comments)    Cannot take due to ulcer.; EGD from 2009 reviewed--normal, no ulcer   Dairy Aid [Tilactase] Other (See Comments)    Headaches.   Gluten Meal Other (See Comments)    Makes her feel foggy.   Penicillins Other (See Comments)    Facial swelling and breathing trouble.   Sulfa Antibiotics Nausea Only   Zinc Trace Metal Additives [Zinc]     "Earrings make ears bleed"    Level of Care/Admitting Diagnosis ED Disposition     ED Disposition  Admit   Condition  --   Comment  Hospital Area: [facility] [[id number]]  Level of Care: Med-Surg [16]  May place patient in observation at [facility] or [facility] if equivalent level of care is available:: No  Covid Evaluation: Asymptomatic - no recent exposure (last 10 days) testing not required  Diagnosis: Left tibial fracture [161096]  Admitting Physician: Bobette Mo [[id number]]  Attending Physician: Bobette Mo [[id number]]          B Medical/Surgery History Past Medical History:  Diagnosis Date   Allergy    seasonal   Hyperlipidemia    Palpitations    h/o, probably benign   Reflux    normal EGD 2009   SVT (supraventricular tachycardia)    Past Surgical History:  Procedure Laterality Date    CESAREAN SECTION  1992   COLONOSCOPY  06/2005   Dr. Sherin Quarry; 11/2014 Dr. Loreta Ave   ESOPHAGOGASTRODUODENOSCOPY  2009   unremarkable     A IV Location/Drains/Wounds Patient Lines/Drains/Airways Status     Active Line/Drains/Airways     None            Intake/Output Last 24 hours No intake or output data in the 24 hours ending 01/15/23 1655  Labs/Imaging No results found for this or any previous visit (from the past 48 hour(s)). CT Knee Left Wo Contrast  Result Date: 01/15/2023 CLINICAL DATA:  Fall, proximal tibial fracture EXAM: CT OF THE LEFT KNEE WITHOUT CONTRAST TECHNIQUE: Multidetector CT imaging of the left knee was performed according to the standard protocol. Multiplanar CT image reconstructions were also generated. RADIATION DOSE REDUCTION: This exam was performed according to the departmental dose-optimization program which includes automated exposure control, adjustment of the mA and/or kV according to patient size and/or use of iterative reconstruction technique. COMPARISON:  X-ray 01/15/2023 FINDINGS: Bones/Joint/Cartilage Wedge-shaped cleavage fracture of the lateral tibial plateau obliquely extending to the medial cortex of the proximal tibial metaphysis (series 5, image 29). No significant articular surface depression. There is 2-3 mm of articular-surface diastasis. No fracture involvement of the medial tibial plateau. No transverse metaphyseal component. The patella, distal femur, and proximal fibula are intact without fracture. Large layering knee joint lipohemarthrosis.  Ligaments Suboptimally assessed by CT. Muscles and Tendons Musculotendinous structures within normal limits by CT. Soft tissues Mild soft tissue swelling.  No hematoma. IMPRESSION: 1. Acute nondepressed fracture of the lateral tibial plateau, as described. 2. Large layering knee joint lipohemarthrosis. Electronically Signed   By: Duanne Guess D.O.   On: 01/15/2023 14:52   DG Hip Unilat W or Wo Pelvis 2-3  Views Left  Result Date: 01/15/2023 CLINICAL DATA:  Fall. EXAM: DG HIP (WITH OR WITHOUT PELVIS) 2-3V LEFT COMPARISON:  None Available. FINDINGS: There is no evidence of hip fracture or dislocation. There is no evidence of arthropathy or other focal bone abnormality. IMPRESSION: Negative. Electronically Signed   By: Sebastian Ache M.D.   On: 01/15/2023 12:27   DG Shoulder Right  Result Date: 01/15/2023 CLINICAL DATA:  Right shoulder pain after fall. EXAM: RIGHT SHOULDER - 2+ VIEW COMPARISON:  None Available. FINDINGS: There is no evidence of fracture or dislocation. There is no evidence of arthropathy or other focal bone abnormality. Soft tissues are unremarkable. IMPRESSION: Negative. Electronically Signed   By: Lupita Raider M.D.   On: 01/15/2023 12:27   DG Knee Complete 4 Views Left  Result Date: 01/15/2023 CLINICAL DATA:  Right knee pain after fall. EXAM: LEFT KNEE - COMPLETE 4+ VIEW COMPARISON:  None Available. FINDINGS: Mildly depressed fracture is seen involving the medial portion of the tibial plateau with intra-articular extension. Small suprapatellar joint effusion is noted. Moderate narrowing of patellofemoral space is noted. IMPRESSION: Mildly depressed medial tibial plateau fracture with intra-articular extension. Electronically Signed   By: Lupita Raider M.D.   On: 01/15/2023 12:26   CT Head Wo Contrast  Result Date: 01/15/2023 CLINICAL DATA:  Provided history: Polytrauma, blunt. EXAM: CT HEAD WITHOUT CONTRAST CT CERVICAL SPINE WITHOUT CONTRAST TECHNIQUE: Multidetector CT imaging of the head and cervical spine was performed following the standard protocol without intravenous contrast. Multiplanar CT image reconstructions of the cervical spine were also generated. RADIATION DOSE REDUCTION: This exam was performed according to the departmental dose-optimization program which includes automated exposure control, adjustment of the mA and/or kV according to patient size and/or use of  iterative reconstruction technique. COMPARISON:  No pertinent prior exams available for comparison. FINDINGS: CT HEAD FINDINGS Brain: Mild cerebral atrophy. CSF density posterior to the cerebellum at midline, measuring 2.2 x 4.3 x 4.4 cm, which may reflect a mega cisterna magna or posterior fossa arachnoid cyst. Partially empty sella turcica. There is no acute intracranial hemorrhage. No demarcated cortical infarct. No evidence of an intracranial mass. No midline shift. Vascular: No hyperdense vessel.  Atherosclerotic calcifications. Skull: No calvarial fracture or aggressive osseous lesion. Sinuses/Orbits: No acute finding within the orbits. No significant paranasal sinus disease at the imaged levels. Other: Forehead and left periorbital hematoma. CT CERVICAL SPINE FINDINGS Alignment: Dextrocurvature of the cervical spine. Slight grade 1 anterolisthesis at C2-C3, C3-C4, C4-C5 and T1-T2 Skull base and vertebrae: The basion-dental and atlanto-dental intervals are maintained.No evidence of acute fracture to the cervical spine. Soft tissues and spinal canal: No prevertebral fluid or swelling. No visible canal hematoma. Disc levels: Cervical spondylosis with mild multilevel disc space narrowing and shallow multilevel disc bulges/central disc protrusions. No appreciable high-grade spinal canal stenosis. No significant bony neural foraminal narrowing. Upper chest: No consolidation within the imaged lung apices. No visible pneumothorax. IMPRESSION: CT head: 1.  No evidence of an acute intracranial abnormality. 2. Forehead and left periorbital hematoma. 3. Partially empty sella turcica. This finding can reflect incidental anatomic  variation, or alternatively, it can be associated with idiopathic intracranial hypertension (pseudotumor cerebri). 4. 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 5. Mild cerebral atrophy. CT cervical spine: 1. No evidence of an acute cervical spine fracture. 2.  Dextrocurvature of the cervical spine. 3. Mild grade 1 anterolisthesis at C2-C3, C3-C4, C4-C5 and T1-T2. 4. Cervical spondylosis as described. Electronically Signed   By: Jackey Loge D.O.   On: 01/15/2023 12:13   CT Cervical Spine Wo Contrast  Result Date: 01/15/2023 CLINICAL DATA:  Provided history: Polytrauma, blunt. EXAM: CT HEAD WITHOUT CONTRAST CT CERVICAL SPINE WITHOUT CONTRAST TECHNIQUE: Multidetector CT imaging of the head and cervical spine was performed following the standard protocol without intravenous contrast. Multiplanar CT image reconstructions of the cervical spine were also generated. RADIATION DOSE REDUCTION: This exam was performed according to the departmental dose-optimization program which includes automated exposure control, adjustment of the mA and/or kV according to patient size and/or use of iterative reconstruction technique. COMPARISON:  No pertinent prior exams available for comparison. FINDINGS: CT HEAD FINDINGS Brain: Mild cerebral atrophy. CSF density posterior to the cerebellum at midline, measuring 2.2 x 4.3 x 4.4 cm, which may reflect a mega cisterna magna or posterior fossa arachnoid cyst. Partially empty sella turcica. There is no acute intracranial hemorrhage. No demarcated cortical infarct. No evidence of an intracranial mass. No midline shift. Vascular: No hyperdense vessel.  Atherosclerotic calcifications. Skull: No calvarial fracture or aggressive osseous lesion. Sinuses/Orbits: No acute finding within the orbits. No significant paranasal sinus disease at the imaged levels. Other: Forehead and left periorbital hematoma. CT CERVICAL SPINE FINDINGS Alignment: Dextrocurvature of the cervical spine. Slight grade 1 anterolisthesis at C2-C3, C3-C4, C4-C5 and T1-T2 Skull base and vertebrae: The basion-dental and atlanto-dental intervals are maintained.No evidence of acute fracture to the cervical spine. Soft tissues and spinal canal: No prevertebral fluid or swelling. No  visible canal hematoma. Disc levels: Cervical spondylosis with mild multilevel disc space narrowing and shallow multilevel disc bulges/central disc protrusions. No appreciable high-grade spinal canal stenosis. No significant bony neural foraminal narrowing. Upper chest: No consolidation within the imaged lung apices. No visible pneumothorax. IMPRESSION: CT head: 1.  No evidence of an acute intracranial abnormality. 2. Forehead and left periorbital hematoma. 3. Partially empty sella turcica. This finding can reflect incidental anatomic variation, or alternatively, it can be associated with idiopathic intracranial hypertension (pseudotumor cerebri). 4. 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 5. Mild cerebral atrophy. CT cervical spine: 1. No evidence of an acute cervical spine fracture. 2. Dextrocurvature of the cervical spine. 3. Mild grade 1 anterolisthesis at C2-C3, C3-C4, C4-C5 and T1-T2. 4. Cervical spondylosis as described. Electronically Signed   By: Jackey Loge D.O.   On: 01/15/2023 12:13    Pending Labs Unresulted Labs (From admission, onward)     Start     Ordered   01/16/23 0500  HIV Antibody (routine testing w rflx)  (HIV Antibody (Routine testing w reflex) panel)  Tomorrow morning,   R        01/15/23 1646   01/16/23 0500  CBC  Tomorrow morning,   R        01/15/23 1646   01/16/23 0500  Comprehensive metabolic panel  Tomorrow morning,   R        01/15/23 1646            Vitals/Pain Today's Vitals   01/15/23 0956 01/15/23 1008 01/15/23 1315 01/15/23 1437  BP: (!) 154/86  119/61   Pulse: 72  83   Resp: 18  17   Temp: 98.5 F (36.9 C)   98 F (36.7 C)  TempSrc: Oral   Oral  SpO2: 100%  95%   PainSc:  6       Isolation Precautions No active isolations  Medications Medications  HYDROmorphone (DILAUDID) injection 0.5 mg (has no administration in time range)  oxyCODONE (Oxy IR/ROXICODONE) immediate release tablet 5 mg (has no  administration in time range)  enoxaparin (LOVENOX) injection 40 mg (has no administration in time range)  acetaminophen (TYLENOL) tablet 650 mg (has no administration in time range)    Or  acetaminophen (TYLENOL) suppository 650 mg (has no administration in time range)  ondansetron (ZOFRAN) tablet 4 mg (has no administration in time range)    Or  ondansetron (ZOFRAN) injection 4 mg (has no administration in time range)  traMADol (ULTRAM) tablet 25 mg (25 mg Oral Given 01/15/23 1636)    Mobility non-ambulatory     Focused Assessments Cardiac Assessment Handoff:    No results found for: "CKTOTAL", "CKMB", "CKMBINDEX", "TROPONINI" No results found for: "DDIMER" Does the Patient currently have chest pain? No    R Recommendations: See Admitting Provider Note  Report given to:   Additional Notes:

## 2023-01-15 NOTE — H&P (Signed)
History and Physical    Patient: Margaret Day:[id number] DOB: January 05, 1956 DOA: 01/15/2023 DOS: the patient was seen and examined on 01/15/2023 PCP: Deatra James, MD  Patient coming from: Home  Chief Complaint:  Chief Complaint  Patient presents with   Fall   HPI: Margaret Day is a 67 y.o. female with medical history significant of seasonal allergies, hyperlipidemia, palpitations, GERD, SVT who was brought to the emergency department after having a fall in the parking lot at [employer] sustaining multiple injuries, most significant on her left knee and left frontal/periorbital area.  Patient stated that she accidentally tripped.  She denies any prodromal symptoms.  She gets occasional palpitations, but has been worked up and is being followed by cardiology. He denied fever, chills, rhinorrhea, sore throat, wheezing or hemoptysis.  No chest pain, diaphoresis, PND, orthopnea or recent pitting edema of the lower extremities.  No abdominal pain, nausea, emesis, diarrhea, constipation, melena or hematochezia.  No flank pain, dysuria, frequency or hematuria.  No polyuria, polydipsia, polyphagia or blurred vision.   Imaging: CT head without contrast with no evidence of acute intracranial normality.  There is forehead and left periorbital hematoma.  Partially empties sella tursica.  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  Mild cerebral atrophy.  CT cervical spine with no evidence of acute cervical spine fracture.  There is dextrocurvature of the cervical spine.  Multilevel's DDD.  Right knee show mildly depressed medial tibial plateau fracture with intra-articular extension.  CT left knee shows an acute nondepressed fracture of the lateral tibial plateau.  There is a large layering knee joint lipohemarthrosis.  Left hip x-ray was negative.  Right shoulder x-ray negative.   ED course: Initial vital signs were temperature 98.5 F, pulse 76,  respiration 20, BP 155/77 mmHg O2 sat 100% on room air.  The patient received tramadol 25 mg p.o. x 1.  The patient was initially going to be discharged with OP follow-up after discussion with orthopedic surgery, but did not tolerate the pain while using the crutches.  EDP has asked [place] to observe for pain control.  Review of Systems: As mentioned in the history of present illness. All other systems reviewed and are negative. Past Medical History:  Diagnosis Date   Allergy    seasonal   Hyperlipidemia    Palpitations    h/o, probably benign   Reflux    normal EGD 2009   SVT (supraventricular tachycardia)    Past Surgical History:  Procedure Laterality Date   CESAREAN SECTION  1992   COLONOSCOPY  06/2005   Dr. Sherin Quarry; 11/2014 Dr. Loreta Ave   ESOPHAGOGASTRODUODENOSCOPY  2009   unremarkable   Social History:  reports that she has never smoked. She has never used smokeless tobacco. She reports current alcohol use. She reports that she does not use drugs.  Allergies  Allergen Reactions   Aspirin Other (See Comments)    Cannot take due to ulcer.; EGD from 2009 reviewed--normal, no ulcer   Dairy Aid [Tilactase] Other (See Comments)    Headaches.   Gluten Meal Other (See Comments)    Makes her feel foggy.   Penicillins Other (See Comments)    Facial swelling and breathing trouble.   Sulfa Antibiotics Nausea Only   Zinc Trace Metal Additives [Zinc]     "Earrings make ears bleed"    Family History  Problem Relation Age of Onset   Down syndrome Brother    GER disease Brother  Diabetes Father    Heart disease Father    Colon polyps Mother    Hypertension Mother    Hyperlipidemia Mother    Cancer Mother        skin   Urolithiasis Sister    Healthy Brother    Autism Son    Autism Son    Cancer Maternal Grandmother        colon   Colon cancer Maternal Grandmother     Prior to Admission medications   Medication Sig Start Date End Date Taking? Authorizing Provider  b complex  vitamins tablet Take 1 tablet by mouth as needed. Reported on 09/02/2015    [provider]  calcium carbonate (TUMS - DOSED IN MG ELEMENTAL CALCIUM) 500 MG chewable tablet Chew 1 tablet by mouth as needed for indigestion or heartburn.    [provider]  Calcium Carbonate Antacid 400 MG CHEW Chew by mouth. Per patient taking 400 mg tablets daily    [provider]  Cholecalciferol (VITAMIN D) 1000 UNITS capsule Take 1,000 Units by mouth daily. Reported on 09/02/2015    [provider]  Coenzyme Q10 (CO Q 10 PO) Take 50 mg by mouth daily. Reported on 09/02/2015    [provider]  Magnesium 200 MG TABS Take by mouth. Per patient taking 400 mg daily    [provider]  metoprolol tartrate (LOPRESSOR) 25 MG tablet Take 0.5 tablets (12.5 mg total) by mouth 2 (two) times daily. 01/27/22   Meriam Sprague, MD  NASAL SALINE NA Place into the nose daily.    [provider]  Omega-3 Fatty Acids (OMEGA 3 500 PO) Take by mouth in the morning and at bedtime.    [provider]  vitamin C (ASCORBIC ACID) 500 MG tablet Take 500 mg by mouth daily.    [provider]  vitamin E 100 UNIT capsule Take 100 Units by mouth as needed. Reported on 09/02/2015    [provider]    Physical Exam: Vitals:   01/15/23 0956 01/15/23 0956 01/15/23 1315 01/15/23 1437  BP: (!) 155/77 (!) 154/86 119/61   Pulse: 76 72 83   Resp: 20 18 17    Temp: 98.5 F (36.9 C) 98.5 F (36.9 C)  98 F (36.7 C)  TempSrc: Oral Oral  Oral  SpO2: 100% 100% 95%    Physical Exam Vitals and nursing note reviewed.  Constitutional:      General: She is awake. She is not in acute distress.    Appearance: Normal appearance.  HENT:     Head: Normocephalic. Contusion present.     Comments: Left frontal/periorbital area contusion with edema, ecchymosis and TTP.    Mouth/Throat:     Mouth: Mucous membranes are moist.  Eyes:     General: No scleral  icterus.    Pupils: Pupils are equal, round, and reactive to light.  Neck:     Vascular: No JVD.  Cardiovascular:     Rate and Rhythm: Normal rate and regular rhythm.     Heart sounds: S1 normal and S2 normal.  Pulmonary:     Effort: Pulmonary effort is normal.     Breath sounds: Normal breath sounds.  Abdominal:     General: Bowel sounds are normal. There is no distension.     Palpations: Abdomen is soft.     Tenderness: There is no abdominal tenderness. There is no guarding.  Musculoskeletal:     Cervical back: Neck supple.  Left knee: Swelling present. Decreased range of motion. Tenderness present.     Right lower leg: No edema.     Left lower leg: No edema.  Skin:    General: Skin is warm and dry.  Neurological:     General: No focal deficit present.     Mental Status: She is alert and oriented to person, place, and time.  Psychiatric:        Mood and Affect: Mood normal.        Behavior: Behavior normal. Behavior is cooperative.     Data Reviewed:  There are no new results to review at this time.  Assessment and Plan: Principal Problem:   Left tibial fracture Observation/MedSurg. Analgesics as needed. Antiemetics as needed. Check CBC, CMP, magnesium and phosphorus. Follow-up with orthopedic surgery as an outpatient.  Active Problems:   Pure hypercholesterolemia Currently not on medical therapy. Continue lifestyle modifications. Follow-up with primary care provider.    SVT (supraventricular tachycardia) Did not tolerate metoprolol. Continue with lifestyle modifications. See cardiology note from 09/04/2022 for further information.    Mild aortic regurgitation Asymptomatic. Follow-up with cardiology as an outpatient.      Advance Care Planning:   Code Status: Full Code   Consults:   Family Communication: Her spouse was at bedside.  Severity of Illness: The appropriate patient status for this patient is OBSERVATION. Observation status is judged  to be reasonable and necessary in order to provide the required intensity of service to ensure the patient's safety. The patient's presenting symptoms, physical exam findings, and initial radiographic and laboratory data in the context of their medical condition is felt to place them at decreased risk for further clinical deterioration. Furthermore, it is anticipated that the patient will be medically stable for discharge from the hospital within 2 midnights of admission.   Author: Bobette Mo, MD 01/15/2023 4:44 PM  For on call review [email or web address].   This document was prepared using Dragon voice recognition software and may contain some unintended transcription errors.

## 2023-01-15 NOTE — Progress Notes (Signed)
Orthopedic Tech Progress Note Patient Details:  Margaret Day [birth year] [id number]  Ortho Devices Type of Ortho Device: Knee Immobilizer, Crutches Ortho Device/Splint Location: LLE Ortho Device/Splint Interventions: Ordered, Application, Adjustment   Post Interventions Patient Tolerated: Poor, Unable to use device properly Instructions Provided: Care of device Attempted crutch training however this was unsuccessful, patient stated it is because they are hungry/tired and that is why they cannot use them. I did not feel safe letting the patient leave with crutches, however the patent is now being admitted and will see PT in the AM for more crutch training. [name] 01/15/2023, 4:38 PM

## 2023-01-15 NOTE — ED Triage Notes (Signed)
Pt tripped and fell at the entrance of [employer]. She caught herself on her knee and hit her head on the glass door. She has pain in her right shoulder and left knee. She also has a swelling on her head.   BP 154/82 HR 86 RR 20 O2 100

## 2023-01-15 NOTE — ED Provider Notes (Signed)
[place] EMERGENCY DEPARTMENT AT [facility] Provider Note   CSN: [id number] Arrival date & time: 01/15/23  0944     History  Chief Complaint  Patient presents with   Marletta Lor    Margaret Day is a 67 y.o. female.  HPI   67 year old female presents emergency department after mechanical fall.  Patient states that she tripped and fell while trying to enter [employer].  She went down onto her left knee and hit the left side of her head on a glass door.  No LOC.  She is currently complaining of left knee and hip pain as well as right shoulder pain.  She has a frontal headache with some mild confusion.  She also endorses some soreness in her neck.  She is not on any anticoagulation.  Denies any syncope.  Home Medications Prior to Admission medications   Medication Sig Start Date End Date Taking? Authorizing Provider  b complex vitamins tablet Take 1 tablet by mouth as needed. Reported on 09/02/2015    [provider]  calcium carbonate (TUMS - DOSED IN MG ELEMENTAL CALCIUM) 500 MG chewable tablet Chew 1 tablet by mouth as needed for indigestion or heartburn.    [provider]  Calcium Carbonate Antacid 400 MG CHEW Chew by mouth. Per patient taking 400 mg tablets daily    [provider]  Cholecalciferol (VITAMIN D) 1000 UNITS capsule Take 1,000 Units by mouth daily. Reported on 09/02/2015    [provider]  Coenzyme Q10 (CO Q 10 PO) Take 50 mg by mouth daily. Reported on 09/02/2015    [provider]  Magnesium 200 MG TABS Take by mouth. Per patient taking 400 mg daily    [provider]  metoprolol tartrate (LOPRESSOR) 25 MG tablet Take 0.5 tablets (12.5 mg total) by mouth 2 (two) times daily. 01/27/22   Meriam Sprague, MD  NASAL SALINE NA Place into the nose daily.    [provider]  Omega-3 Fatty Acids (OMEGA 3 500 PO) Take by mouth in the morning and at bedtime.    [provider]  vitamin C  (ASCORBIC ACID) 500 MG tablet Take 500 mg by mouth daily.    [provider]  vitamin E 100 UNIT capsule Take 100 Units by mouth as needed. Reported on 09/02/2015    [provider]      Allergies    Aspirin, Dairy aid [tilactase], Gluten meal, Penicillins, Sulfa antibiotics, and Zinc trace metal additives [zinc]    Review of Systems   Review of Systems  Constitutional:  Negative for fever.  Respiratory:  Negative for shortness of breath.   Cardiovascular:  Negative for chest pain and palpitations.  Gastrointestinal:  Negative for abdominal pain.  Musculoskeletal:  Positive for neck pain. Negative for back pain.       + Left knee and + right shoulder pain  Skin:  Negative for rash.  Neurological:  Positive for headaches. Negative for syncope.  Psychiatric/Behavioral:  Positive for confusion.     Physical Exam Updated Vital Signs BP (!) 154/86   Pulse 72   Temp 98.5 F (36.9 C) (Oral)   Resp 18   LMP 07/20/2006   SpO2 100%  Physical Exam Vitals and nursing note reviewed.  Constitutional:      General: She is not in acute distress.    Appearance: Normal appearance.  HENT:     Head: Normocephalic.     Mouth/Throat:  Mouth: Mucous membranes are moist.  Eyes:     Extraocular Movements: Extraocular movements intact.     Pupils: Pupils are equal, round, and reactive to light.  Neck:     Comments: C-collar in place Cardiovascular:     Rate and Rhythm: Normal rate.  Pulmonary:     Effort: Pulmonary effort is normal. No respiratory distress.  Abdominal:     Palpations: Abdomen is soft.     Tenderness: There is no abdominal tenderness.  Musculoskeletal:     Cervical back: Tenderness present.     Comments: Mild tenderness to palpation of the left anterior knee, joint feels stable, no significant swelling or deformity, mild tenderness to palpation of the left greater trochanter without significant bruising/deformity.  Lower extremities are neurovascularly  intact.  Mild tenderness to palpation of the musculature of the right shoulder but no tenderness along the clavicle, humerus or shoulder blade.  Skin:    General: Skin is warm.  Neurological:     General: No focal deficit present.     Mental Status: She is alert and oriented to person, place, and time. Mental status is at baseline.  Psychiatric:        Mood and Affect: Mood normal.     ED Results / Procedures / Treatments   Labs (all labs ordered are listed, but only abnormal results are displayed) Labs Reviewed - No data to display  EKG None  Radiology No results found.  Procedures Procedures    Medications Ordered in ED Medications - No data to display  ED Course/ Medical Decision Making/ A&P                             Medical Decision Making Amount and/or Complexity of Data Reviewed Radiology: ordered.  Risk Prescription drug management. Decision regarding hospitalization.   67 year old female presents emergency department mechanical fall, left knee pain, right shoulder pain and left head injury.  No loss of consciousness or syncope.  She has a mild left frontal hematoma.  Not on any anticoagulation.  Vitals are stable, she is neuro intact.  She has left knee pain to palpation without significant deformity, legs are neurovascularly intact.  CT of the head and neck show hematoma but no other acute finding, c-collar cleared.  X-ray of the shoulder and hip show no acute fracture.  However x-ray of the left knee shows a medial tibial plateau fracture.  Consulted orthopedics, Dr. Merlyn Albert.  He recommends CT of the knee, knee immobilizer and crutches with outpatient follow-up.  CT was completed, confirms x-ray findings.  Left leg is placed in a knee immobilizer and continues to have good peripheral blood flow.  However patient is not managing the crutches well.  We attempted a walker which did not do well and would not fit in the home either.  Patient up to this point  has refused any pain medicine due to upset stomach.  However patient is not a safe discharge home.  At this time we will let her eat and drink encourage pain medicine and will plan for admission for PT and evaluation.  I have repaged orthopedic service to let them know that the patient will be staying inpatient.  Patients evaluation and results requires admission for further treatment and care.  Spoke with hospitalist, reviewed patient's ED course and they accept admission.  Patient agrees with admission plan, offers no new complaints and is stable/unchanged at time of admit.  Final Clinical Impression(s) / ED Diagnoses Final diagnoses:  None    Rx / DC Orders ED Discharge Orders     None         Rozelle Logan, DO 01/15/23 1700

## 2023-01-15 NOTE — Progress Notes (Signed)
Patient refused iv access. Has not needed any pain medications intravenously. Was made aware that one can be placed at anytime if needed.

## 2023-01-16 ENCOUNTER — Encounter (HOSPITAL_COMMUNITY): Payer: Self-pay | Admitting: Internal Medicine

## 2023-01-16 DIAGNOSIS — Z833 Family history of diabetes mellitus: Secondary | ICD-10-CM | POA: Diagnosis not present

## 2023-01-16 DIAGNOSIS — S82142A Displaced bicondylar fracture of left tibia, initial encounter for closed fracture: Secondary | ICD-10-CM | POA: Diagnosis present

## 2023-01-16 DIAGNOSIS — Z886 Allergy status to analgesic agent status: Secondary | ICD-10-CM | POA: Diagnosis not present

## 2023-01-16 DIAGNOSIS — S82102D Unspecified fracture of upper end of left tibia, subsequent encounter for closed fracture with routine healing: Secondary | ICD-10-CM | POA: Diagnosis not present

## 2023-01-16 DIAGNOSIS — I471 Supraventricular tachycardia, unspecified: Secondary | ICD-10-CM | POA: Diagnosis present

## 2023-01-16 DIAGNOSIS — Y92512 Supermarket, store or market as the place of occurrence of the external cause: Secondary | ICD-10-CM | POA: Diagnosis not present

## 2023-01-16 DIAGNOSIS — I351 Nonrheumatic aortic (valve) insufficiency: Secondary | ICD-10-CM | POA: Diagnosis not present

## 2023-01-16 DIAGNOSIS — Z880 Allergy status to penicillin: Secondary | ICD-10-CM | POA: Diagnosis not present

## 2023-01-16 DIAGNOSIS — S0010XA Contusion of unspecified eyelid and periocular area, initial encounter: Secondary | ICD-10-CM | POA: Diagnosis present

## 2023-01-16 DIAGNOSIS — E7800 Pure hypercholesterolemia, unspecified: Secondary | ICD-10-CM | POA: Diagnosis present

## 2023-01-16 DIAGNOSIS — Z8279 Family history of other congenital malformations, deformations and chromosomal abnormalities: Secondary | ICD-10-CM | POA: Diagnosis not present

## 2023-01-16 DIAGNOSIS — K219 Gastro-esophageal reflux disease without esophagitis: Secondary | ICD-10-CM | POA: Diagnosis present

## 2023-01-16 DIAGNOSIS — E871 Hypo-osmolality and hyponatremia: Secondary | ICD-10-CM | POA: Diagnosis not present

## 2023-01-16 DIAGNOSIS — Z83438 Family history of other disorder of lipoprotein metabolism and other lipidemia: Secondary | ICD-10-CM | POA: Diagnosis not present

## 2023-01-16 DIAGNOSIS — E876 Hypokalemia: Secondary | ICD-10-CM | POA: Diagnosis present

## 2023-01-16 DIAGNOSIS — Z800 Family history of malignant neoplasm of digestive organs: Secondary | ICD-10-CM | POA: Diagnosis not present

## 2023-01-16 DIAGNOSIS — R002 Palpitations: Secondary | ICD-10-CM | POA: Diagnosis present

## 2023-01-16 DIAGNOSIS — W010XXA Fall on same level from slipping, tripping and stumbling without subsequent striking against object, initial encounter: Secondary | ICD-10-CM | POA: Diagnosis present

## 2023-01-16 DIAGNOSIS — Z882 Allergy status to sulfonamides status: Secondary | ICD-10-CM | POA: Diagnosis not present

## 2023-01-16 DIAGNOSIS — Z9109 Other allergy status, other than to drugs and biological substances: Secondary | ICD-10-CM | POA: Diagnosis not present

## 2023-01-16 DIAGNOSIS — M25062 Hemarthrosis, left knee: Secondary | ICD-10-CM | POA: Diagnosis present

## 2023-01-16 DIAGNOSIS — Z8249 Family history of ischemic heart disease and other diseases of the circulatory system: Secondary | ICD-10-CM | POA: Diagnosis not present

## 2023-01-16 DIAGNOSIS — Z83719 Family history of colon polyps, unspecified: Secondary | ICD-10-CM | POA: Diagnosis not present

## 2023-01-16 DIAGNOSIS — Z888 Allergy status to other drugs, medicaments and biological substances status: Secondary | ICD-10-CM | POA: Diagnosis not present

## 2023-01-16 DIAGNOSIS — Z79899 Other long term (current) drug therapy: Secondary | ICD-10-CM | POA: Diagnosis not present

## 2023-01-16 DIAGNOSIS — S82302A Unspecified fracture of lower end of left tibia, initial encounter for closed fracture: Secondary | ICD-10-CM | POA: Diagnosis not present

## 2023-01-16 DIAGNOSIS — W19XXXA Unspecified fall, initial encounter: Secondary | ICD-10-CM | POA: Diagnosis not present

## 2023-01-16 LAB — HIV ANTIBODY (ROUTINE TESTING W REFLEX): HIV Screen 4th Generation wRfx: NONREACTIVE

## 2023-01-16 NOTE — Progress Notes (Addendum)
PROGRESS NOTE   Margaret Day  ZOX:[id number]    DOB: [birth year]    DOA: 01/15/2023  PCP: Deatra James, MD   I have briefly reviewed patients previous medical records in [facility].  Chief Complaint  Patient presents with   Fall    Brief Narrative:  67 year old married female with PMH of HLD, SVT, GERD, presented to the ED after sustaining a mechanical fall at a grocery store sustaining multiple injuries, most significantly to her left knee and left frontal/periorbital area.  No syncope.  Admitted for mechanical fall and left knee acute nondepressed fracture of lateral tibial plateau with a large layering knee joint lipohemarthrosis.  EDP consulted orthopedics who recommended nonoperative/conservative management and outpatient follow-up.  However patient unable to manage with crutches and was admitted for observation, pain management and further evaluation.  As per orthopedic input, patient to transfer to [facility] possibly on 6/30 for surgical intervention on 7/1.   Assessment & Plan:  Principal Problem:   Left tibial fracture Active Problems:   Pure hypercholesterolemia   SVT (supraventricular tachycardia)   Mild aortic regurgitation   Acute nondepressed fracture of left lateral tibial plateau with a large layering knee lipo hemarthrosis: Sustained after mechanical fall PTA CT of the left knee: Acute nondepressed fracture of the lateral tibial plateau and Large layering knee joint lipohemarthrosis. CT head, C-spine without acute findings.  Incidental findings. X-ray of the right shoulder and hip with left pelvis negative. EDP discussed with Dr. Yevette Edwards, orthopedics who had recommended knee immobilizer, crutches with outpatient follow-up but patient was unable to do well with crutches or walker and was admitted. Patient apprehensive and reports inability to tolerate aspirin, ibuprofen, even Tylenol. Has tolerated tramadol and oxycodone.  Continue oxycodone for  pain. Consulted Dr. Turner Daniels (covering for Dr. Yevette Edwards) who indicated that this fracture would require surgery.  However the appropriate surgeon to do this is not available until Monday 7/1.  After discussing with his partners, he called back and advised that patient is to be nonweightbearing on LLE, possible transfer to [facility] on 6/30 for surgical intervention on 7/1.  Updated patient via RN.  HLD: Continue prior home meds/fish oil  SVT: Reportedly did not tolerate metoprolol.  Follows with outpatient cardiology.  Body mass index is 27.7 kg/m.   ACP Documents: None present. DVT prophylaxis: SCDs Start: 01/15/23 1752     Code Status: Full Code:  Family Communication: None at bedside. Disposition:  Status is: Inpatient     Consultants:     Procedures:     Antimicrobials:      Subjective:  Patient reports that she does not have much pain when she is not moving her left lower extremity.  Has difficulty getting off but to use the toilet etc.  Reports several medication intolerances as noted above  Objective:   Vitals:   01/15/23 1812 01/15/23 2133 01/16/23 0131 01/16/23 0606  BP: (!) 143/72 134/72 (!) 150/71 (!) 145/73  Pulse:  94 98 91  Resp: 20 17 17 17   Temp: 98 F (36.7 C) 98 F (36.7 C) 99 F (37.2 C) 98.5 F (36.9 C)  TempSrc: Oral Oral    SpO2:  96% 98% 96%  Weight:  68.7 kg    Height:  5\' 2"  (1.575 m)      General exam: Middle-age female, moderately built and nourished lying comfortably propped up in bed without distress. HEENT: Mild periorbital ecchymosis without significant swelling. PERTLA.  EOM movement intact.  No visual difficulties reported.  Respiratory system: Clear to auscultation. Respiratory effort normal. Cardiovascular system: S1 & S2 heard, RRR. No JVD, murmurs, rubs, gallops or clicks. No pedal edema. Gastrointestinal system: Abdomen is nondistended, soft and nontender. No organomegaly or masses felt. Normal bowel sounds heard. Central nervous  system: Alert and oriented. No focal neurological deficits. Extremities: Symmetric 5 x 5 power except left lower extremity which is in a knee immobilizer and movement restricted due to pain.  Able to move ankle, feet and toes without difficulty.  Distal pulses well felt. Skin: No rashes, lesions or ulcers Psychiatry: Judgement and insight appear normal. Mood & affect appropriate.     Data Reviewed:   I have personally reviewed following labs and imaging studies   CBC: Recent Labs  Lab 01/15/23 1826  WBC 13.7*  HGB 14.2  HCT 41.5  MCV 91.4  PLT 274    Basic Metabolic Panel: Recent Labs  Lab 01/15/23 1826  NA 133*  K 3.5  CL 101  CO2 22  GLUCOSE 145*  BUN 12  CREATININE 0.56  CALCIUM 9.2    Liver Function Tests: Recent Labs  Lab 01/15/23 1826  AST 21  ALT 22  ALKPHOS 70  BILITOT 0.8  PROT 7.3  ALBUMIN 4.0    CBG: No results for input(s): "GLUCAP" in the last 168 hours.  Microbiology Studies:  No results found for this or any previous visit (from the past 240 hour(s)).  Radiology Studies:  CT Knee Left Wo Contrast  Result Date: 01/15/2023 CLINICAL DATA:  Fall, proximal tibial fracture EXAM: CT OF THE LEFT KNEE WITHOUT CONTRAST TECHNIQUE: Multidetector CT imaging of the left knee was performed according to the standard protocol. Multiplanar CT image reconstructions were also generated. RADIATION DOSE REDUCTION: This exam was performed according to the departmental dose-optimization program which includes automated exposure control, adjustment of the mA and/or kV according to patient size and/or use of iterative reconstruction technique. COMPARISON:  X-ray 01/15/2023 FINDINGS: Bones/Joint/Cartilage Wedge-shaped cleavage fracture of the lateral tibial plateau obliquely extending to the medial cortex of the proximal tibial metaphysis (series 5, image 29). No significant articular surface depression. There is 2-3 mm of articular-surface diastasis. No fracture  involvement of the medial tibial plateau. No transverse metaphyseal component. The patella, distal femur, and proximal fibula are intact without fracture. Large layering knee joint lipohemarthrosis. Ligaments Suboptimally assessed by CT. Muscles and Tendons Musculotendinous structures within normal limits by CT. Soft tissues Mild soft tissue swelling.  No hematoma. IMPRESSION: 1. Acute nondepressed fracture of the lateral tibial plateau, as described. 2. Large layering knee joint lipohemarthrosis. Electronically Signed   By: Duanne Guess D.O.   On: 01/15/2023 14:52   DG Hip Unilat W or Wo Pelvis 2-3 Views Left  Result Date: 01/15/2023 CLINICAL DATA:  Fall. EXAM: DG HIP (WITH OR WITHOUT PELVIS) 2-3V LEFT COMPARISON:  None Available. FINDINGS: There is no evidence of hip fracture or dislocation. There is no evidence of arthropathy or other focal bone abnormality. IMPRESSION: Negative. Electronically Signed   By: Sebastian Ache M.D.   On: 01/15/2023 12:27   DG Shoulder Right  Result Date: 01/15/2023 CLINICAL DATA:  Right shoulder pain after fall. EXAM: RIGHT SHOULDER - 2+ VIEW COMPARISON:  None Available. FINDINGS: There is no evidence of fracture or dislocation. There is no evidence of arthropathy or other focal bone abnormality. Soft tissues are unremarkable. IMPRESSION: Negative. Electronically Signed   By: Lupita Raider M.D.   On: 01/15/2023 12:27   DG Knee Complete 4 Views  Left  Result Date: 01/15/2023 CLINICAL DATA:  Right knee pain after fall. EXAM: LEFT KNEE - COMPLETE 4+ VIEW COMPARISON:  None Available. FINDINGS: Mildly depressed fracture is seen involving the medial portion of the tibial plateau with intra-articular extension. Small suprapatellar joint effusion is noted. Moderate narrowing of patellofemoral space is noted. IMPRESSION: Mildly depressed medial tibial plateau fracture with intra-articular extension. Electronically Signed   By: Lupita Raider M.D.   On: 01/15/2023 12:26   CT  Head Wo Contrast  Result Date: 01/15/2023 CLINICAL DATA:  Provided history: Polytrauma, blunt. EXAM: CT HEAD WITHOUT CONTRAST CT CERVICAL SPINE WITHOUT CONTRAST TECHNIQUE: Multidetector CT imaging of the head and cervical spine was performed following the standard protocol without intravenous contrast. Multiplanar CT image reconstructions of the cervical spine were also generated. RADIATION DOSE REDUCTION: This exam was performed according to the departmental dose-optimization program which includes automated exposure control, adjustment of the mA and/or kV according to patient size and/or use of iterative reconstruction technique. COMPARISON:  No pertinent prior exams available for comparison. FINDINGS: CT HEAD FINDINGS Brain: Mild cerebral atrophy. CSF density posterior to the cerebellum at midline, measuring 2.2 x 4.3 x 4.4 cm, which may reflect a mega cisterna magna or posterior fossa arachnoid cyst. Partially empty sella turcica. There is no acute intracranial hemorrhage. No demarcated cortical infarct. No evidence of an intracranial mass. No midline shift. Vascular: No hyperdense vessel.  Atherosclerotic calcifications. Skull: No calvarial fracture or aggressive osseous lesion. Sinuses/Orbits: No acute finding within the orbits. No significant paranasal sinus disease at the imaged levels. Other: Forehead and left periorbital hematoma. CT CERVICAL SPINE FINDINGS Alignment: Dextrocurvature of the cervical spine. Slight grade 1 anterolisthesis at C2-C3, C3-C4, C4-C5 and T1-T2 Skull base and vertebrae: The basion-dental and atlanto-dental intervals are maintained.No evidence of acute fracture to the cervical spine. Soft tissues and spinal canal: No prevertebral fluid or swelling. No visible canal hematoma. Disc levels: Cervical spondylosis with mild multilevel disc space narrowing and shallow multilevel disc bulges/central disc protrusions. No appreciable high-grade spinal canal stenosis. No significant bony  neural foraminal narrowing. Upper chest: No consolidation within the imaged lung apices. No visible pneumothorax. IMPRESSION: CT head: 1.  No evidence of an acute intracranial abnormality. 2. Forehead and left periorbital hematoma. 3. Partially empty sella turcica. This finding can reflect incidental anatomic variation, or alternatively, it can be associated with idiopathic intracranial hypertension (pseudotumor cerebri). 4. 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 5. Mild cerebral atrophy. CT cervical spine: 1. No evidence of an acute cervical spine fracture. 2. Dextrocurvature of the cervical spine. 3. Mild grade 1 anterolisthesis at C2-C3, C3-C4, C4-C5 and T1-T2. 4. Cervical spondylosis as described. Electronically Signed   By: Jackey Loge D.O.   On: 01/15/2023 12:13   CT Cervical Spine Wo Contrast  Result Date: 01/15/2023 CLINICAL DATA:  Provided history: Polytrauma, blunt. EXAM: CT HEAD WITHOUT CONTRAST CT CERVICAL SPINE WITHOUT CONTRAST TECHNIQUE: Multidetector CT imaging of the head and cervical spine was performed following the standard protocol without intravenous contrast. Multiplanar CT image reconstructions of the cervical spine were also generated. RADIATION DOSE REDUCTION: This exam was performed according to the departmental dose-optimization program which includes automated exposure control, adjustment of the mA and/or kV according to patient size and/or use of iterative reconstruction technique. COMPARISON:  No pertinent prior exams available for comparison. FINDINGS: CT HEAD FINDINGS Brain: Mild cerebral atrophy. CSF density posterior to the cerebellum at midline, measuring 2.2 x 4.3 x 4.4 cm, which  may reflect a mega cisterna magna or posterior fossa arachnoid cyst. Partially empty sella turcica. There is no acute intracranial hemorrhage. No demarcated cortical infarct. No evidence of an intracranial mass. No midline shift. Vascular: No hyperdense vessel.   Atherosclerotic calcifications. Skull: No calvarial fracture or aggressive osseous lesion. Sinuses/Orbits: No acute finding within the orbits. No significant paranasal sinus disease at the imaged levels. Other: Forehead and left periorbital hematoma. CT CERVICAL SPINE FINDINGS Alignment: Dextrocurvature of the cervical spine. Slight grade 1 anterolisthesis at C2-C3, C3-C4, C4-C5 and T1-T2 Skull base and vertebrae: The basion-dental and atlanto-dental intervals are maintained.No evidence of acute fracture to the cervical spine. Soft tissues and spinal canal: No prevertebral fluid or swelling. No visible canal hematoma. Disc levels: Cervical spondylosis with mild multilevel disc space narrowing and shallow multilevel disc bulges/central disc protrusions. No appreciable high-grade spinal canal stenosis. No significant bony neural foraminal narrowing. Upper chest: No consolidation within the imaged lung apices. No visible pneumothorax. IMPRESSION: CT head: 1.  No evidence of an acute intracranial abnormality. 2. Forehead and left periorbital hematoma. 3. Partially empty sella turcica. This finding can reflect incidental anatomic variation, or alternatively, it can be associated with idiopathic intracranial hypertension (pseudotumor cerebri). 4. 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 5. Mild cerebral atrophy. CT cervical spine: 1. No evidence of an acute cervical spine fracture. 2. Dextrocurvature of the cervical spine. 3. Mild grade 1 anterolisthesis at C2-C3, C3-C4, C4-C5 and T1-T2. 4. Cervical spondylosis as described. Electronically Signed   By: Jackey Loge D.O.   On: 01/15/2023 12:13    Scheduled Meds:    Continuous Infusions:     LOS: 0 days     Marcellus Scott, MD,  FACP, [facility], [facility], [facility], [facility]   [facility] [place]     To contact the attending provider between 7A-7P or the covering provider during after hours 7P-7A,  please log into the web site [email or web address] and access using universal [place] password for that web site. If you do not have the password, please call the hospital operator.  01/16/2023, 9:53 AM

## 2023-01-16 NOTE — Plan of Care (Signed)

## 2023-01-16 NOTE — Progress Notes (Signed)
PT Cancellation Note  Patient Details Name: Margaret Day MRN: [id number] DOB: 1956-04-20   Cancelled Treatment:     PT order received but eval deferred - RN advises per physician, no PT this date and pt to transfer to [facility] tomorrow for possible surgery Monday.  PT will follow.   [name] 01/16/2023, 12:53 PM

## 2023-01-16 NOTE — TOC Initial Note (Signed)
Transition of Care [facility]) - Initial/Assessment Note    Patient Details  Name: Margaret Day MRN: [id number] Date of Birth: 1955/09/01  Transition of Care [facility]) CM/SW Contact:    Georgie Chard, LCSW Phone Number: 01/16/2023, 10:21 AM  Clinical Narrative:                 CSW spoke to the patient at this time patient is awaiting PT to see if she can hold pressure on her leg. Patient states that she will possibly need HH if she goes home prior to possible surgery. This CSW asked the patient did she have any safety concerns or in need of any other resources. The patient has stated that [facility] will be all she needed at this time. TOC will continue to follow patient for duration of stay.  Expected Discharge Plan: Home w Home Health Services Barriers to Discharge: No Barriers Identified   Patient Goals and CMS Choice Patient states their goals for this hospitalization and ongoing recovery are:: Patient may need surgery in hopes to go home prior to surgery          Expected Discharge Plan and Services       Living arrangements for the past 2 months: Single Family Home                                      Prior Living Arrangements/Services Living arrangements for the past 2 months: Single Family Home Lives with:: Spouse          Need for Family Participation in Patient Care: No (Comment) Care giver support system in place?: Yes (comment)   Criminal Activity/Legal Involvement Pertinent to Current Situation/Hospitalization: No - Comment as needed  Activities of Daily Living Home Assistive Devices/Equipment: Crutches ADL Screening (condition at time of admission) Patient's cognitive ability adequate to safely complete daily activities?: Yes Is the patient deaf or have difficulty hearing?: No Does the patient have difficulty seeing, even when wearing glasses/contacts?: No Does the patient have difficulty concentrating, remembering, or making decisions?: No Patient  able to express need for assistance with ADLs?: Yes Does the patient have difficulty dressing or bathing?: No Independently performs ADLs?: Yes (appropriate for developmental age) Does the patient have difficulty walking or climbing stairs?: Yes Weakness of Legs: Left Weakness of Arms/Hands: None  Permission Sought/Granted                  Emotional Assessment Appearance:: Appears stated age     Orientation: : Oriented to Self, Oriented to Place, Oriented to  Time, Oriented to Situation   Psych Involvement: No (comment)  Admission diagnosis:  Left tibial fracture [S82.202A] Fall, initial encounter [W19.XXXA] Closed fracture of left tibial plateau, initial encounter [S82.142A] Patient Active Problem List   Diagnosis Date Noted   Family history of malignant neoplasm of digestive organs 01/15/2023   Colon cancer screening 01/15/2023   Left tibial fracture 01/15/2023   SVT (supraventricular tachycardia) 01/15/2023   Mild aortic regurgitation 01/15/2023   Pure hypercholesterolemia 12/21/2012   PCP:  Deatra James, MD Pharmacy:   [facility] [place], [place] - [address] [place] [address] Phone: [phone] Fax: [phone]     Social Determinants of Health (SDOH) Social History: SDOH Screenings   Food Insecurity: No Food Insecurity (01/15/2023)  Housing: Low Risk  (01/15/2023)  Transportation Needs: No  Transportation Needs (01/15/2023)  Utilities: Not At Risk (01/15/2023)  Tobacco Use: Low Risk  (01/16/2023)   SDOH Interventions:     Readmission Risk Interventions     No data to display

## 2023-01-17 DIAGNOSIS — S82102D Unspecified fracture of upper end of left tibia, subsequent encounter for closed fracture with routine healing: Secondary | ICD-10-CM | POA: Diagnosis not present

## 2023-01-17 MED ORDER — LACTATED RINGERS IV SOLN: INTRAVENOUS | Status: DC

## 2023-01-17 MED ORDER — MELATONIN 3 MG PO TABS
1.5000 mg | ORAL_TABLET | Freq: Every day | ORAL | Status: DC
  Administered 2023-01-18: 1.5 mg via ORAL
  Filled 2023-01-17 (×2): qty 1

## 2023-01-17 MED ORDER — VITAMIN D 25 MCG (1000 UNIT) PO TABS
1000.0000 [IU] | ORAL_TABLET | Freq: Every day | ORAL | Status: DC
  Administered 2023-01-17 – 2023-01-20 (×4): 1000 [IU] via ORAL
  Filled 2023-01-17 (×5): qty 1

## 2023-01-17 NOTE — Progress Notes (Signed)
PROGRESS NOTE   Margaret Day  EXB:[id number]    DOB: Apr 22, 1956    DOA: 01/15/2023  PCP: Deatra James, MD   I have briefly reviewed patients previous medical records in [facility].  Chief Complaint  Patient presents with   Fall    Brief Narrative:  67 year old married female with PMH of HLD, SVT, GERD, presented to the ED after sustaining a mechanical fall at a grocery store sustaining multiple injuries, most significantly to her left knee and left frontal/periorbital area.  No syncope.  Admitted for mechanical fall and left knee acute nondepressed fracture of lateral tibial plateau with a large layering knee joint lipohemarthrosis.  EDP consulted orthopedics who recommended nonoperative/conservative management and outpatient follow-up.  However patient unable to manage with crutches and was admitted for observation, pain management and further evaluation.  As per orthopedic input, patient to transfer to [facility] possibly on 6/30 for surgical intervention on 7/1.   Assessment & Plan:  Principal Problem:   Left tibial fracture Active Problems:   Pure hypercholesterolemia   SVT (supraventricular tachycardia)   Mild aortic regurgitation   Acute nondepressed fracture of left lateral tibial plateau with a large layering knee lipo hemarthrosis: - Sustained after mechanical fall PTA - CT of the left knee: Acute nondepressed fracture of the lateral tibial plateau and Large layering knee joint lipohemarthrosis. - CT head, C-spine without acute findings.  Incidental findings. - X-ray of the right shoulder and hip with left pelvis negative. - EDP discussed with Dr. Yevette Edwards, orthopedics who had recommended knee immobilizer, crutches with outpatient follow-up but patient was unable to do well with crutches or walker and was admitted. - Patient apprehensive and reports inability to tolerate aspirin, ibuprofen, even Tylenol. - Has tolerated tramadol and oxycodone.  Continue oxycodone for  pain. - Consulted Dr. Turner Daniels, Orthopedics.  His input much appreciated.  Indicates minimally displaced, but unstable Schatzker 4 fracture of the left tibial plateau.  He has consulted Dr. Carola Frost, orthopedic trauma with plans for ORIF on 7/1.  N.p.o. after midnight. -Patient has good exercise tolerance, asymptomatic of cardiorespiratory symptoms, may proceed with indicated surgery with usual close preop monitoring.  HLD: - Continue prior home meds/fish oil at discharge.  SVT: - Reportedly did not tolerate metoprolol.  Follows with outpatient cardiology. -As per Dr. Shari Prows, cardiology office note from 09/04/2022: SVT well-controlled with few breakthrough episodes.  Did not tolerate metoprolol due to fatigue but has been managing well with conservative management including hydration, stress relief and exercise.  TTE with normal BiV function and no significant valve disease.  Mild to moderate AR.   Body mass index is 27.7 kg/m.   ACP Documents: None present. DVT prophylaxis: SCDs Start: 01/15/23 1752     Code Status: Full Code:  Family Communication: None at bedside. Disposition:  Status is: Inpatient     Consultants:   Orthopedics  Procedures:     Antimicrobials:      Subjective:  Pain controlled on Oxy IR.  Reports that she ambulates approximately half a mile a day.  Also swims at times.  Denies dyspnea, chest pain, palpitations, dizziness or lightheadedness.  Apart from SVT as noted above, denies cardiac history, pulmonary history and non-smoker.  Objective:   Vitals:   01/16/23 1438 01/16/23 2025 01/16/23 2300 01/17/23 0438  BP: 138/67 (!) 148/86  (!) 151/69  Pulse: 94  88 99  Resp: 16 18  18   Temp: 98.4 F (36.9 C) 98.7 F (37.1 C)  98.5 F (36.9  C)  TempSrc: Oral Oral  Oral  SpO2: 97% 95%  95%  Weight:      Height:        General exam: Middle-age female, moderately built and nourished lying comfortably propped up in bed without distress. HEENT: Mild  periorbital ecchymosis without significant swelling. PERTLA.  EOM movement intact.  No visual difficulties reported.  Today noted minimal right infraorbital ecchymosis. Respiratory system: Clear to auscultation. Respiratory effort normal. Cardiovascular system: S1 & S2 heard, RRR. No JVD, murmurs, rubs, gallops or clicks. No pedal edema. Gastrointestinal system: Abdomen is nondistended, soft and nontender. No organomegaly or masses felt. Normal bowel sounds heard. Central nervous system: Alert and oriented. No focal neurological deficits. Extremities: Symmetric 5 x 5 power except left lower extremity which is in a knee immobilizer and movement restricted due to pain.  Able to move ankle, feet and toes without difficulty.  Distal pulses well felt. Skin: No rashes, lesions or ulcers Psychiatry: Judgement and insight appear normal. Mood & affect appropriate.     Data Reviewed:   I have personally reviewed following labs and imaging studies   CBC: Recent Labs  Lab 01/15/23 1826  WBC 13.7*  HGB 14.2  HCT 41.5  MCV 91.4  PLT 274    Basic Metabolic Panel: Recent Labs  Lab 01/15/23 1826  NA 133*  K 3.5  CL 101  CO2 22  GLUCOSE 145*  BUN 12  CREATININE 0.56  CALCIUM 9.2    Liver Function Tests: Recent Labs  Lab 01/15/23 1826  AST 21  ALT 22  ALKPHOS 70  BILITOT 0.8  PROT 7.3  ALBUMIN 4.0    CBG: No results for input(s): "GLUCAP" in the last 168 hours.  Microbiology Studies:  No results found for this or any previous visit (from the past 240 hour(s)).  Radiology Studies:  CT Knee Left Wo Contrast  Result Date: 01/15/2023 CLINICAL DATA:  Fall, proximal tibial fracture EXAM: CT OF THE LEFT KNEE WITHOUT CONTRAST TECHNIQUE: Multidetector CT imaging of the left knee was performed according to the standard protocol. Multiplanar CT image reconstructions were also generated. RADIATION DOSE REDUCTION: This exam was performed according to the departmental dose-optimization  program which includes automated exposure control, adjustment of the mA and/or kV according to patient size and/or use of iterative reconstruction technique. COMPARISON:  X-ray 01/15/2023 FINDINGS: Bones/Joint/Cartilage Wedge-shaped cleavage fracture of the lateral tibial plateau obliquely extending to the medial cortex of the proximal tibial metaphysis (series 5, image 29). No significant articular surface depression. There is 2-3 mm of articular-surface diastasis. No fracture involvement of the medial tibial plateau. No transverse metaphyseal component. The patella, distal femur, and proximal fibula are intact without fracture. Large layering knee joint lipohemarthrosis. Ligaments Suboptimally assessed by CT. Muscles and Tendons Musculotendinous structures within normal limits by CT. Soft tissues Mild soft tissue swelling.  No hematoma. IMPRESSION: 1. Acute nondepressed fracture of the lateral tibial plateau, as described. 2. Large layering knee joint lipohemarthrosis. Electronically Signed   By: Duanne Guess D.O.   On: 01/15/2023 14:52   DG Hip Unilat W or Wo Pelvis 2-3 Views Left  Result Date: 01/15/2023 CLINICAL DATA:  Fall. EXAM: DG HIP (WITH OR WITHOUT PELVIS) 2-3V LEFT COMPARISON:  None Available. FINDINGS: There is no evidence of hip fracture or dislocation. There is no evidence of arthropathy or other focal bone abnormality. IMPRESSION: Negative. Electronically Signed   By: Sebastian Ache M.D.   On: 01/15/2023 12:27   DG Shoulder Right  Result  Date: 01/15/2023 CLINICAL DATA:  Right shoulder pain after fall. EXAM: RIGHT SHOULDER - 2+ VIEW COMPARISON:  None Available. FINDINGS: There is no evidence of fracture or dislocation. There is no evidence of arthropathy or other focal bone abnormality. Soft tissues are unremarkable. IMPRESSION: Negative. Electronically Signed   By: Lupita Raider M.D.   On: 01/15/2023 12:27   DG Knee Complete 4 Views Left  Result Date: 01/15/2023 CLINICAL DATA:  Right  knee pain after fall. EXAM: LEFT KNEE - COMPLETE 4+ VIEW COMPARISON:  None Available. FINDINGS: Mildly depressed fracture is seen involving the medial portion of the tibial plateau with intra-articular extension. Small suprapatellar joint effusion is noted. Moderate narrowing of patellofemoral space is noted. IMPRESSION: Mildly depressed medial tibial plateau fracture with intra-articular extension. Electronically Signed   By: Lupita Raider M.D.   On: 01/15/2023 12:26   CT Head Wo Contrast  Result Date: 01/15/2023 CLINICAL DATA:  Provided history: Polytrauma, blunt. EXAM: CT HEAD WITHOUT CONTRAST CT CERVICAL SPINE WITHOUT CONTRAST TECHNIQUE: Multidetector CT imaging of the head and cervical spine was performed following the standard protocol without intravenous contrast. Multiplanar CT image reconstructions of the cervical spine were also generated. RADIATION DOSE REDUCTION: This exam was performed according to the departmental dose-optimization program which includes automated exposure control, adjustment of the mA and/or kV according to patient size and/or use of iterative reconstruction technique. COMPARISON:  No pertinent prior exams available for comparison. FINDINGS: CT HEAD FINDINGS Brain: Mild cerebral atrophy. CSF density posterior to the cerebellum at midline, measuring 2.2 x 4.3 x 4.4 cm, which may reflect a mega cisterna magna or posterior fossa arachnoid cyst. Partially empty sella turcica. There is no acute intracranial hemorrhage. No demarcated cortical infarct. No evidence of an intracranial mass. No midline shift. Vascular: No hyperdense vessel.  Atherosclerotic calcifications. Skull: No calvarial fracture or aggressive osseous lesion. Sinuses/Orbits: No acute finding within the orbits. No significant paranasal sinus disease at the imaged levels. Other: Forehead and left periorbital hematoma. CT CERVICAL SPINE FINDINGS Alignment: Dextrocurvature of the cervical spine. Slight grade 1  anterolisthesis at C2-C3, C3-C4, C4-C5 and T1-T2 Skull base and vertebrae: The basion-dental and atlanto-dental intervals are maintained.No evidence of acute fracture to the cervical spine. Soft tissues and spinal canal: No prevertebral fluid or swelling. No visible canal hematoma. Disc levels: Cervical spondylosis with mild multilevel disc space narrowing and shallow multilevel disc bulges/central disc protrusions. No appreciable high-grade spinal canal stenosis. No significant bony neural foraminal narrowing. Upper chest: No consolidation within the imaged lung apices. No visible pneumothorax. IMPRESSION: CT head: 1.  No evidence of an acute intracranial abnormality. 2. Forehead and left periorbital hematoma. 3. Partially empty sella turcica. This finding can reflect incidental anatomic variation, or alternatively, it can be associated with idiopathic intracranial hypertension (pseudotumor cerebri). 4. 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 5. Mild cerebral atrophy. CT cervical spine: 1. No evidence of an acute cervical spine fracture. 2. Dextrocurvature of the cervical spine. 3. Mild grade 1 anterolisthesis at C2-C3, C3-C4, C4-C5 and T1-T2. 4. Cervical spondylosis as described. Electronically Signed   By: Jackey Loge D.O.   On: 01/15/2023 12:13   CT Cervical Spine Wo Contrast  Result Date: 01/15/2023 CLINICAL DATA:  Provided history: Polytrauma, blunt. EXAM: CT HEAD WITHOUT CONTRAST CT CERVICAL SPINE WITHOUT CONTRAST TECHNIQUE: Multidetector CT imaging of the head and cervical spine was performed following the standard protocol without intravenous contrast. Multiplanar CT image reconstructions of the cervical spine were also  generated. RADIATION DOSE REDUCTION: This exam was performed according to the departmental dose-optimization program which includes automated exposure control, adjustment of the mA and/or kV according to patient size and/or use of iterative  reconstruction technique. COMPARISON:  No pertinent prior exams available for comparison. FINDINGS: CT HEAD FINDINGS Brain: Mild cerebral atrophy. CSF density posterior to the cerebellum at midline, measuring 2.2 x 4.3 x 4.4 cm, which may reflect a mega cisterna magna or posterior fossa arachnoid cyst. Partially empty sella turcica. There is no acute intracranial hemorrhage. No demarcated cortical infarct. No evidence of an intracranial mass. No midline shift. Vascular: No hyperdense vessel.  Atherosclerotic calcifications. Skull: No calvarial fracture or aggressive osseous lesion. Sinuses/Orbits: No acute finding within the orbits. No significant paranasal sinus disease at the imaged levels. Other: Forehead and left periorbital hematoma. CT CERVICAL SPINE FINDINGS Alignment: Dextrocurvature of the cervical spine. Slight grade 1 anterolisthesis at C2-C3, C3-C4, C4-C5 and T1-T2 Skull base and vertebrae: The basion-dental and atlanto-dental intervals are maintained.No evidence of acute fracture to the cervical spine. Soft tissues and spinal canal: No prevertebral fluid or swelling. No visible canal hematoma. Disc levels: Cervical spondylosis with mild multilevel disc space narrowing and shallow multilevel disc bulges/central disc protrusions. No appreciable high-grade spinal canal stenosis. No significant bony neural foraminal narrowing. Upper chest: No consolidation within the imaged lung apices. No visible pneumothorax. IMPRESSION: CT head: 1.  No evidence of an acute intracranial abnormality. 2. Forehead and left periorbital hematoma. 3. Partially empty sella turcica. This finding can reflect incidental anatomic variation, or alternatively, it can be associated with idiopathic intracranial hypertension (pseudotumor cerebri). 4. 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 5. Mild cerebral atrophy. CT cervical spine: 1. No evidence of an acute cervical spine fracture. 2.  Dextrocurvature of the cervical spine. 3. Mild grade 1 anterolisthesis at C2-C3, C3-C4, C4-C5 and T1-T2. 4. Cervical spondylosis as described. Electronically Signed   By: Jackey Loge D.O.   On: 01/15/2023 12:13    Scheduled Meds:    Continuous Infusions:     LOS: 1 day     Marcellus Scott, MD,  FACP, FHM, [facility], [facility], [facility]   [facility] [place]     To contact the attending provider between 7A-7P or the covering provider during after hours 7P-7A, please log into the web site [email or web address] and access using universal [place] password for that web site. If you do not have the password, please call the hospital operator.  01/17/2023, 10:41 AM

## 2023-01-17 NOTE — Progress Notes (Signed)
PT Cancellation Note  Patient Details Name: Margaret Day MRN: [id number] DOB: 1955-09-01   Cancelled Treatment:    Reason Eval/Treat Not Completed: Medical issues which prohibited therapy. Pt awaiting ORIF of tibial plateau fx, scheduled for tomorrow. PT will follow up after surgery is complete.   Arlyss Gandy 01/17/2023, 2:31 PM

## 2023-01-17 NOTE — Plan of Care (Signed)

## 2023-01-17 NOTE — Plan of Care (Signed)

## 2023-01-17 NOTE — Consult Note (Signed)
Reason for Consult: Left tibial plateau fracture, Schatzker type IV Referring Physician: Dr. Ruthe Mannan is an 67 y.o. female.  HPI: 67 year old female who was shopping at [employer] on 01/15/2023.  She was moving quickly slipped and fell and sustained a minimally displaced mildly comminuted left tibial plateau fracture Schatzker type IV as well as a periorbital bruise/hematoma.  She was transported to the [facility].  My partner, Dr. Yevette Edwards, was consulted over the phone.  Patient was placed in a knee immobilizer but unfortunately was unable to mobilize with physical therapy.  She stayed in the ER overnight and I was consulted the next day to see her for her tibial plateau fracture.  She denied any loss of consciousness and denied any other injuries to her extremities.   Past Medical History:  Diagnosis Date   Allergy    seasonal   Hyperlipidemia    Palpitations    h/o, probably benign   Reflux    normal EGD 2009   SVT (supraventricular tachycardia)     Past Surgical History:  Procedure Laterality Date   CESAREAN SECTION  1992   COLONOSCOPY  06/2005   Dr. Sherin Quarry; 11/2014 Dr. Loreta Ave   ESOPHAGOGASTRODUODENOSCOPY  2009   unremarkable    Family History  Problem Relation Age of Onset   Down syndrome Brother    GER disease Brother    Diabetes Father    Heart disease Father    Colon polyps Mother    Hypertension Mother    Hyperlipidemia Mother    Cancer Mother        skin   Urolithiasis Sister    Healthy Brother    Autism Son    Autism Son    Cancer Maternal Grandmother        colon   Colon cancer Maternal Grandmother     Social History:  reports that she has never smoked. She has never used smokeless tobacco. She reports current alcohol use. She reports that she does not use drugs.  Allergies:  Allergies  Allergen Reactions   Aspirin Other (See Comments)    Cannot take due to ulcer.; EGD from 2009 reviewed--normal, no ulcer   Dairy  Aid [Tilactase] Other (See Comments)    Headaches.   Gluten Meal Other (See Comments)    Makes her feel foggy.   Penicillins Other (See Comments)    Facial swelling and breathing trouble.   Sulfa Antibiotics Nausea Only   Zinc Trace Metal Additives [Zinc]     "Earrings make ears bleed"    Medications: I have reviewed the patient's current medications.  Results for orders placed or performed during the hospital encounter of 01/15/23 (from the past 48 hour(s))  CBC     Status: Abnormal   Collection Time: 01/15/23  6:26 PM  Result Value Ref Range   WBC 13.7 (H) 4.0 - 10.5 K/uL   RBC 4.54 3.87 - 5.11 MIL/uL   Hemoglobin 14.2 12.0 - 15.0 g/dL   HCT 96.2 95.2 - 84.1 %   MCV 91.4 80.0 - 100.0 fL   MCH 31.3 26.0 - 34.0 pg   MCHC 34.2 30.0 - 36.0 g/dL   RDW 32.4 40.1 - 02.7 %   Platelets 274 150 - 400 K/uL   nRBC 0.0 0.0 - 0.2 %    Comment: Performed at [facility], [address] [place], [place] [address]  Comprehensive metabolic panel     Status: Abnormal   Collection Time:  01/15/23  6:26 PM  Result Value Ref Range   Sodium 133 (L) 135 - 145 mmol/L   Potassium 3.5 3.5 - 5.1 mmol/L   Chloride 101 98 - 111 mmol/L   CO2 22 22 - 32 mmol/L   Glucose, Bld 145 (H) 70 - 99 mg/dL    Comment: Glucose reference range applies only to samples taken after fasting for at least 8 hours.   BUN 12 8 - 23 mg/dL   Creatinine, Ser 1.61 0.44 - 1.00 mg/dL   Calcium 9.2 8.9 - 09.6 mg/dL   Total Protein 7.3 6.5 - 8.1 g/dL   Albumin 4.0 3.5 - 5.0 g/dL   AST 21 15 - 41 U/L   ALT 22 0 - 44 U/L   Alkaline Phosphatase 70 38 - 126 U/L   Total Bilirubin 0.8 0.3 - 1.2 mg/dL   GFR, Estimated >04 >54 mL/min    Comment: (NOTE) Calculated using the CKD-EPI Creatinine Equation (2021)    Anion gap 10 5 - 15    Comment: Performed at [facility], [address] [place], [place] [address]  HIV Antibody (routine testing w rflx)     Status: None   Collection Time:  01/16/23  4:23 AM  Result Value Ref Range   HIV Screen 4th Generation wRfx Non Reactive Non Reactive    Comment: Performed at [facility], [address] [place], [place] [address]    CT Knee Left Wo Contrast  Result Date: 01/15/2023 CLINICAL DATA:  Fall, proximal tibial fracture EXAM: CT OF THE LEFT KNEE WITHOUT CONTRAST TECHNIQUE: Multidetector CT imaging of the left knee was performed according to the standard protocol. Multiplanar CT image reconstructions were also generated. RADIATION DOSE REDUCTION: This exam was performed according to the departmental dose-optimization program which includes automated exposure control, adjustment of the mA and/or kV according to patient size and/or use of iterative reconstruction technique. COMPARISON:  X-ray 01/15/2023 FINDINGS: Bones/Joint/Cartilage Wedge-shaped cleavage fracture of the lateral tibial plateau obliquely extending to the medial cortex of the proximal tibial metaphysis (series 5, image 29). No significant articular surface depression. There is 2-3 mm of articular-surface diastasis. No fracture involvement of the medial tibial plateau. No transverse metaphyseal component. The patella, distal femur, and proximal fibula are intact without fracture. Large layering knee joint lipohemarthrosis. Ligaments Suboptimally assessed by CT. Muscles and Tendons Musculotendinous structures within normal limits by CT. Soft tissues Mild soft tissue swelling.  No hematoma. IMPRESSION: 1. Acute nondepressed fracture of the lateral tibial plateau, as described. 2. Large layering knee joint lipohemarthrosis. Electronically Signed   By: Duanne Guess D.O.   On: 01/15/2023 14:52   DG Hip Unilat W or Wo Pelvis 2-3 Views Left  Result Date: 01/15/2023 CLINICAL DATA:  Fall. EXAM: DG HIP (WITH OR WITHOUT PELVIS) 2-3V LEFT COMPARISON:  None Available. FINDINGS: There is no evidence of hip fracture or dislocation. There is no evidence of arthropathy or other focal  bone abnormality. IMPRESSION: Negative. Electronically Signed   By: Sebastian Ache M.D.   On: 01/15/2023 12:27   DG Shoulder Right  Result Date: 01/15/2023 CLINICAL DATA:  Right shoulder pain after fall. EXAM: RIGHT SHOULDER - 2+ VIEW COMPARISON:  None Available. FINDINGS: There is no evidence of fracture or dislocation. There is no evidence of arthropathy or other focal bone abnormality. Soft tissues are unremarkable. IMPRESSION: Negative. Electronically Signed   By: Lupita Raider M.D.   On: 01/15/2023 12:27   DG Knee Complete 4  Views Left  Result Date: 01/15/2023 CLINICAL DATA:  Right knee pain after fall. EXAM: LEFT KNEE - COMPLETE 4+ VIEW COMPARISON:  None Available. FINDINGS: Mildly depressed fracture is seen involving the medial portion of the tibial plateau with intra-articular extension. Small suprapatellar joint effusion is noted. Moderate narrowing of patellofemoral space is noted. IMPRESSION: Mildly depressed medial tibial plateau fracture with intra-articular extension. Electronically Signed   By: Lupita Raider M.D.   On: 01/15/2023 12:26   CT Head Wo Contrast  Result Date: 01/15/2023 CLINICAL DATA:  Provided history: Polytrauma, blunt. EXAM: CT HEAD WITHOUT CONTRAST CT CERVICAL SPINE WITHOUT CONTRAST TECHNIQUE: Multidetector CT imaging of the head and cervical spine was performed following the standard protocol without intravenous contrast. Multiplanar CT image reconstructions of the cervical spine were also generated. RADIATION DOSE REDUCTION: This exam was performed according to the departmental dose-optimization program which includes automated exposure control, adjustment of the mA and/or kV according to patient size and/or use of iterative reconstruction technique. COMPARISON:  No pertinent prior exams available for comparison. FINDINGS: CT HEAD FINDINGS Brain: Mild cerebral atrophy. 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 Partially empty sella turcica. There is no acute intracranial hemorrhage. No demarcated cortical infarct. No evidence of an intracranial mass. No midline shift. Vascular: No hyperdense vessel.  Atherosclerotic calcifications. Skull: No calvarial fracture or aggressive osseous lesion. Sinuses/Orbits: No acute finding within the orbits. No significant paranasal sinus disease at the imaged levels. Other: Forehead and left periorbital hematoma. CT CERVICAL SPINE FINDINGS Alignment: Dextrocurvature of the cervical spine. Slight grade 1 anterolisthesis at C2-C3, C3-C4, C4-C5 and T1-T2 Skull base and vertebrae: The basion-dental and atlanto-dental intervals are maintained.No evidence of acute fracture to the cervical spine. Soft tissues and spinal canal: No prevertebral fluid or swelling. No visible canal hematoma. Disc levels: Cervical spondylosis with mild multilevel disc space narrowing and shallow multilevel disc bulges/central disc protrusions. No appreciable high-grade spinal canal stenosis. No significant bony neural foraminal narrowing. Upper chest: No consolidation within the imaged lung apices. No visible pneumothorax. IMPRESSION: CT head: 1.  No evidence of an acute intracranial abnormality. 2. Forehead and left periorbital hematoma. 3. Partially empty sella turcica. This finding can reflect incidental anatomic variation, or alternatively, it can be associated with idiopathic intracranial hypertension (pseudotumor cerebri). 4. 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 5. Mild cerebral atrophy. CT cervical spine: 1. No evidence of an acute cervical spine fracture. 2. Dextrocurvature of the cervical spine. 3. Mild grade 1 anterolisthesis at C2-C3, C3-C4, C4-C5 and T1-T2. 4. Cervical spondylosis as described. Electronically Signed   By: Jackey Loge D.O.   On: 01/15/2023 12:13   CT Cervical Spine Wo Contrast  Result Date:  01/15/2023 CLINICAL DATA:  Provided history: Polytrauma, blunt. EXAM: CT HEAD WITHOUT CONTRAST CT CERVICAL SPINE WITHOUT CONTRAST TECHNIQUE: Multidetector CT imaging of the head and cervical spine was performed following the standard protocol without intravenous contrast. Multiplanar CT image reconstructions of the cervical spine were also generated. RADIATION DOSE REDUCTION: This exam was performed according to the departmental dose-optimization program which includes automated exposure control, adjustment of the mA and/or kV according to patient size and/or use of iterative reconstruction technique. COMPARISON:  No pertinent prior exams available for comparison. FINDINGS: CT HEAD FINDINGS Brain: Mild cerebral atrophy. CSF density posterior to the cerebellum at midline, measuring 2.2 x 4.3 x 4.4 cm,  which may reflect a mega cisterna magna or posterior fossa arachnoid cyst. Partially empty sella turcica. There is no acute intracranial hemorrhage. No demarcated cortical infarct. No evidence of an intracranial mass. No midline shift. Vascular: No hyperdense vessel.  Atherosclerotic calcifications. Skull: No calvarial fracture or aggressive osseous lesion. Sinuses/Orbits: No acute finding within the orbits. No significant paranasal sinus disease at the imaged levels. Other: Forehead and left periorbital hematoma. CT CERVICAL SPINE FINDINGS Alignment: Dextrocurvature of the cervical spine. Slight grade 1 anterolisthesis at C2-C3, C3-C4, C4-C5 and T1-T2 Skull base and vertebrae: The basion-dental and atlanto-dental intervals are maintained.No evidence of acute fracture to the cervical spine. Soft tissues and spinal canal: No prevertebral fluid or swelling. No visible canal hematoma. Disc levels: Cervical spondylosis with mild multilevel disc space narrowing and shallow multilevel disc bulges/central disc protrusions. No appreciable high-grade spinal canal stenosis. No significant bony neural foraminal narrowing. Upper  chest: No consolidation within the imaged lung apices. No visible pneumothorax. IMPRESSION: CT head: 1.  No evidence of an acute intracranial abnormality. 2. Forehead and left periorbital hematoma. 3. Partially empty sella turcica. This finding can reflect incidental anatomic variation, or alternatively, it can be associated with idiopathic intracranial hypertension (pseudotumor cerebri). 4. 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 5. Mild cerebral atrophy. CT cervical spine: 1. No evidence of an acute cervical spine fracture. 2. Dextrocurvature of the cervical spine. 3. Mild grade 1 anterolisthesis at C2-C3, C3-C4, C4-C5 and T1-T2. 4. Cervical spondylosis as described. Electronically Signed   By: Jackey Loge D.O.   On: 01/15/2023 12:13    Review of Systems patient denies any chest pain or shortness of breath.  She reports no difficulty seeing out of her left eye and there is a bruise in the periorbital region 1+. Blood pressure (!) 151/69, pulse 99, temperature 98.5 F (36.9 C), temperature source Oral, resp. rate 18, height 5\' 2"  (1.575 m), weight 68.7 kg, last menstrual period 07/20/2006, SpO2 95 %. Physical Exam: Patient's knee immobilizer is removed her skin is intact no significant bruising no abrasions.  She can move her toes up and down without difficulty has normal sensation to her toes.  The swelling about the knee I would rated at 1-2+ she has significant discomfort with any attempts at range of motion.  Her calf is soft and nontender.  There is no pain to palpation over her hips or pelvis.  Full range of motion of the contralateral right lower extremity.  Assessment/Plan: 67 year old female with minimally displaced, but unstable, Schatzker 4 fracture of the left tibial plateau.  I have consulted Dr. Carola Frost of the orthopedic trauma service who is kind enough to accept her in transfer for open reduction internal fixation of her fracture tomorrow.  I have  reviewed the x-ray images with the patient in preparation for her transfer and surgery tomorrow.  Nestor Lewandowsky 01/17/2023, 7:43 AM

## 2023-01-18 ENCOUNTER — Other Ambulatory Visit: Payer: Self-pay

## 2023-01-18 ENCOUNTER — Encounter (HOSPITAL_COMMUNITY)
Admission: EM | Disposition: A | Discharge status: Home Health | Payer: Self-pay | Source: Home / Self Care | Attending: Internal Medicine

## 2023-01-18 ENCOUNTER — Inpatient Hospital Stay (HOSPITAL_COMMUNITY): Payer: 59 | Admitting: Certified Registered Nurse Anesthetist

## 2023-01-18 ENCOUNTER — Encounter (HOSPITAL_COMMUNITY): Payer: Self-pay | Admitting: Internal Medicine

## 2023-01-18 ENCOUNTER — Inpatient Hospital Stay (HOSPITAL_COMMUNITY): Payer: 59

## 2023-01-18 DIAGNOSIS — I351 Nonrheumatic aortic (valve) insufficiency: Secondary | ICD-10-CM | POA: Diagnosis not present

## 2023-01-18 DIAGNOSIS — S82302A Unspecified fracture of lower end of left tibia, initial encounter for closed fracture: Secondary | ICD-10-CM | POA: Diagnosis not present

## 2023-01-18 DIAGNOSIS — S82142A Displaced bicondylar fracture of left tibia, initial encounter for closed fracture: Secondary | ICD-10-CM | POA: Diagnosis not present

## 2023-01-18 DIAGNOSIS — W19XXXA Unspecified fall, initial encounter: Secondary | ICD-10-CM

## 2023-01-18 HISTORY — PX: ORIF TIBIA PLATEAU: SHX2132

## 2023-01-18 LAB — CBC
HCT: 37 % (ref 36.0–46.0)
Hemoglobin: 12.8 g/dL (ref 12.0–15.0)
MCH: 31.1 pg (ref 26.0–34.0)
MCHC: 34.6 g/dL (ref 30.0–36.0)
MCV: 89.8 fL (ref 80.0–100.0)
Platelets: 240 10*3/uL (ref 150–400)
RBC: 4.12 MIL/uL (ref 3.87–5.11)
RDW: 12.2 % (ref 11.5–15.5)
WBC: 9.2 10*3/uL (ref 4.0–10.5)
nRBC: 0 % (ref 0.0–0.2)

## 2023-01-18 LAB — BASIC METABOLIC PANEL
Anion gap: 11 (ref 5–15)
BUN: 6 mg/dL — ABNORMAL LOW (ref 8–23)
CO2: 23 mmol/L (ref 22–32)
Calcium: 8.7 mg/dL — ABNORMAL LOW (ref 8.9–10.3)
Chloride: 98 mmol/L (ref 98–111)
Creatinine, Ser: 0.64 mg/dL (ref 0.44–1.00)
GFR, Estimated: >60 mL/min (ref >60)
Glucose, Bld: 112 mg/dL — ABNORMAL HIGH (ref 70–99)
Potassium: 3.2 mmol/L — ABNORMAL LOW (ref 3.5–5.1)
Sodium: 132 mmol/L — ABNORMAL LOW (ref 135–145)

## 2023-01-18 LAB — SURGICAL PCR SCREEN
MRSA, PCR: NEGATIVE
Staphylococcus aureus: NEGATIVE

## 2023-01-18 LAB — GLUCOSE, CAPILLARY: Glucose-Capillary: 99 mg/dL (ref 70–99)

## 2023-01-18 SURGERY — OPEN REDUCTION INTERNAL FIXATION (ORIF) TIBIAL PLATEAU
Anesthesia: General | Site: Knee | Laterality: Left

## 2023-01-18 MED ORDER — DIPHENHYDRAMINE HCL 50 MG/ML IJ SOLN
INTRAMUSCULAR | Status: AC
  Filled 2023-01-18: qty 1

## 2023-01-18 MED ORDER — CEFAZOLIN SODIUM-DEXTROSE 2-3 GM-%(50ML) IV SOLR
INTRAVENOUS | Status: DC | PRN
  Administered 2023-01-18: 2 g via INTRAVENOUS

## 2023-01-18 MED ORDER — FENTANYL CITRATE (PF) 250 MCG/5ML IJ SOLN
INTRAMUSCULAR | Status: AC
  Filled 2023-01-18: qty 5

## 2023-01-18 MED ORDER — ENOXAPARIN SODIUM 40 MG/0.4ML IJ SOSY
40.0000 mg | PREFILLED_SYRINGE | INTRAMUSCULAR | Status: DC
  Administered 2023-01-19 – 2023-01-20 (×2): 40 mg via SUBCUTANEOUS
  Filled 2023-01-18 (×2): qty 0.4

## 2023-01-18 MED ORDER — HYDROMORPHONE HCL 1 MG/ML IJ SOLN
INTRAMUSCULAR | Status: AC
  Filled 2023-01-18: qty 1

## 2023-01-18 MED ORDER — LIDOCAINE 2% (20 MG/ML) 5 ML SYRINGE
INTRAMUSCULAR | Status: AC
  Filled 2023-01-18: qty 5

## 2023-01-18 MED ORDER — CEFAZOLIN SODIUM-DEXTROSE 1-4 GM/50ML-% IV SOLN
1.0000 g | Freq: Four times a day (QID) | INTRAVENOUS | Status: AC
  Administered 2023-01-18 – 2023-01-19 (×3): 1 g via INTRAVENOUS
  Filled 2023-01-18 (×3): qty 50

## 2023-01-18 MED ORDER — ONDANSETRON HCL 4 MG/2ML IJ SOLN
INTRAMUSCULAR | Status: DC | PRN
  Administered 2023-01-18: 4 mg via INTRAVENOUS

## 2023-01-18 MED ORDER — METOCLOPRAMIDE HCL 5 MG PO TABS: 5.0000 mg | ORAL_TABLET | Freq: Three times a day (TID) | ORAL | Status: DC | PRN

## 2023-01-18 MED ORDER — PROMETHAZINE HCL 25 MG/ML IJ SOLN: 6.2500 mg | INTRAMUSCULAR | Status: DC | PRN

## 2023-01-18 MED ORDER — POLYETHYLENE GLYCOL 3350 17 G PO PACK
17.0000 g | PACK | Freq: Every day | ORAL | Status: DC | PRN
  Filled 2023-01-18: qty 1

## 2023-01-18 MED ORDER — 0.9 % SODIUM CHLORIDE (POUR BTL) OPTIME
TOPICAL | Status: DC | PRN
  Administered 2023-01-18: 1000 mL

## 2023-01-18 MED ORDER — PHENYLEPHRINE 80 MCG/ML (10ML) SYRINGE FOR IV PUSH (FOR BLOOD PRESSURE SUPPORT)
PREFILLED_SYRINGE | INTRAVENOUS | Status: AC
  Filled 2023-01-18: qty 10

## 2023-01-18 MED ORDER — LACTATED RINGERS IV SOLN: INTRAVENOUS | Status: DC

## 2023-01-18 MED ORDER — CEFAZOLIN SODIUM-DEXTROSE 2-4 GM/100ML-% IV SOLN
INTRAVENOUS | Status: AC
  Filled 2023-01-18: qty 100

## 2023-01-18 MED ORDER — SUGAMMADEX SODIUM 200 MG/2ML IV SOLN
INTRAVENOUS | Status: DC | PRN
  Administered 2023-01-18: 137.4 mg via INTRAVENOUS

## 2023-01-18 MED ORDER — METOCLOPRAMIDE HCL 5 MG/ML IJ SOLN: 5.0000 mg | Freq: Three times a day (TID) | INTRAMUSCULAR | Status: DC | PRN

## 2023-01-18 MED ORDER — ONDANSETRON HCL 4 MG/2ML IJ SOLN: 4.0000 mg | Freq: Four times a day (QID) | INTRAMUSCULAR | Status: DC | PRN

## 2023-01-18 MED ORDER — FENTANYL CITRATE (PF) 250 MCG/5ML IJ SOLN
INTRAMUSCULAR | Status: DC | PRN
  Administered 2023-01-18: 25 ug via INTRAVENOUS
  Administered 2023-01-18: 50 ug via INTRAVENOUS
  Administered 2023-01-18: 25 ug via INTRAVENOUS
  Administered 2023-01-18: 100 ug via INTRAVENOUS
  Administered 2023-01-18: 50 ug via INTRAVENOUS

## 2023-01-18 MED ORDER — METHOCARBAMOL 500 MG PO TABS
500.0000 mg | ORAL_TABLET | Freq: Three times a day (TID) | ORAL | Status: DC | PRN
  Administered 2023-01-18 – 2023-01-19 (×2): 500 mg via ORAL
  Filled 2023-01-18 (×3): qty 1

## 2023-01-18 MED ORDER — ORAL CARE MOUTH RINSE: 15.0000 mL | Freq: Once | OROMUCOSAL | Status: AC

## 2023-01-18 MED ORDER — PHENYLEPHRINE HCL (PRESSORS) 10 MG/ML IV SOLN
INTRAVENOUS | Status: DC | PRN
  Administered 2023-01-18: 80 ug via INTRAVENOUS

## 2023-01-18 MED ORDER — AMISULPRIDE (ANTIEMETIC) 5 MG/2ML IV SOLN: 10.0000 mg | Freq: Once | INTRAVENOUS | Status: DC | PRN

## 2023-01-18 MED ORDER — ESMOLOL HCL 100 MG/10ML IV SOLN
INTRAVENOUS | Status: AC
  Filled 2023-01-18: qty 10

## 2023-01-18 MED ORDER — DEXAMETHASONE SODIUM PHOSPHATE 10 MG/ML IJ SOLN
INTRAMUSCULAR | Status: AC
  Filled 2023-01-18: qty 1

## 2023-01-18 MED ORDER — MEPERIDINE HCL 25 MG/ML IJ SOLN: 6.2500 mg | INTRAMUSCULAR | Status: DC | PRN

## 2023-01-18 MED ORDER — ACETAMINOPHEN 500 MG PO TABS: 1000.0000 mg | ORAL_TABLET | Freq: Once | ORAL | Status: DC

## 2023-01-18 MED ORDER — PROPOFOL 10 MG/ML IV BOLUS
INTRAVENOUS | Status: AC
  Filled 2023-01-18: qty 20

## 2023-01-18 MED ORDER — PROPOFOL 10 MG/ML IV BOLUS
INTRAVENOUS | Status: DC | PRN
  Administered 2023-01-18: 100 mg via INTRAVENOUS

## 2023-01-18 MED ORDER — ROCURONIUM BROMIDE 10 MG/ML (PF) SYRINGE
PREFILLED_SYRINGE | INTRAVENOUS | Status: DC | PRN
  Administered 2023-01-18: 40 mg via INTRAVENOUS
  Administered 2023-01-18: 10 mg via INTRAVENOUS

## 2023-01-18 MED ORDER — POTASSIUM CHLORIDE CRYS ER 20 MEQ PO TBCR
40.0000 meq | EXTENDED_RELEASE_TABLET | Freq: Once | ORAL | Status: AC
  Administered 2023-01-18: 40 meq via ORAL
  Filled 2023-01-18: qty 2

## 2023-01-18 MED ORDER — ROCURONIUM BROMIDE 10 MG/ML (PF) SYRINGE
PREFILLED_SYRINGE | INTRAVENOUS | Status: AC
  Filled 2023-01-18: qty 10

## 2023-01-18 MED ORDER — OXYCODONE HCL 5 MG PO TABS
5.0000 mg | ORAL_TABLET | ORAL | Status: DC | PRN
  Administered 2023-01-18 (×2): 7.5 mg via ORAL
  Administered 2023-01-19 (×2): 5 mg via ORAL
  Administered 2023-01-19 (×2): 7.5 mg via ORAL
  Administered 2023-01-19 – 2023-01-20 (×5): 5 mg via ORAL
  Filled 2023-01-18 (×3): qty 1
  Filled 2023-01-18 (×2): qty 2
  Filled 2023-01-18 (×3): qty 1
  Filled 2023-01-18 (×2): qty 2
  Filled 2023-01-18: qty 1

## 2023-01-18 MED ORDER — DIPHENHYDRAMINE HCL 50 MG/ML IJ SOLN
INTRAMUSCULAR | Status: DC | PRN
  Administered 2023-01-18: 25 mg via INTRAVENOUS

## 2023-01-18 MED ORDER — DOCUSATE SODIUM 100 MG PO CAPS
100.0000 mg | ORAL_CAPSULE | Freq: Two times a day (BID) | ORAL | Status: DC
  Administered 2023-01-18 – 2023-01-20 (×3): 100 mg via ORAL
  Filled 2023-01-18 (×5): qty 1

## 2023-01-18 MED ORDER — HYDROMORPHONE HCL 1 MG/ML IJ SOLN
0.2500 mg | INTRAMUSCULAR | Status: DC | PRN
  Administered 2023-01-18 (×2): 0.5 mg via INTRAVENOUS

## 2023-01-18 MED ORDER — LIDOCAINE 2% (20 MG/ML) 5 ML SYRINGE
INTRAMUSCULAR | Status: DC | PRN
  Administered 2023-01-18: 60 mg via INTRAVENOUS

## 2023-01-18 MED ORDER — ACETAMINOPHEN 10 MG/ML IV SOLN
INTRAVENOUS | Status: DC | PRN
  Administered 2023-01-18: 1000 mg via INTRAVENOUS

## 2023-01-18 MED ORDER — ONDANSETRON HCL 4 MG/2ML IJ SOLN
INTRAMUSCULAR | Status: AC
  Filled 2023-01-18: qty 2

## 2023-01-18 MED ORDER — CHLORHEXIDINE GLUCONATE 0.12 % MT SOLN
15.0000 mL | Freq: Once | OROMUCOSAL | Status: AC
  Administered 2023-01-18: 15 mL via OROMUCOSAL

## 2023-01-18 MED ORDER — ONDANSETRON HCL 4 MG PO TABS: 4.0000 mg | ORAL_TABLET | Freq: Four times a day (QID) | ORAL | Status: DC | PRN

## 2023-01-18 SURGICAL SUPPLY — 87 items
BAG COUNTER SPONGE SURGICOUNT (BAG) ×2 IMPLANT
BAG SPNG CNTER NS LX DISP (BAG) ×1
BANDAGE ESMARK 6X9 LF (GAUZE/BANDAGES/DRESSINGS) ×2 IMPLANT
BIT DRILL 3.3 LONG (BIT) IMPLANT
BIT DRILL 4.3 (BIT) IMPLANT
BIT DRILL QC 3.3X195 (BIT) IMPLANT
BLADE CLIPPER SURG (BLADE) IMPLANT
BLADE SURG 10 STRL SS (BLADE) ×2 IMPLANT
BLADE SURG 15 STRL LF DISP TIS (BLADE) ×2 IMPLANT
BLADE SURG 15 STRL SS (BLADE) ×1
BNDG CMPR 9X6 STRL LF SNTH (GAUZE/BANDAGES/DRESSINGS)
BNDG CMPR STD VLCR NS LF 5.8X4 (GAUZE/BANDAGES/DRESSINGS) ×1
BNDG COHESIVE 4X5 TAN STRL (GAUZE/BANDAGES/DRESSINGS) ×2 IMPLANT
BNDG ELASTIC 4X5.8 VLCR NS LF (GAUZE/BANDAGES/DRESSINGS) IMPLANT
BNDG ELASTIC 4X5.8 VLCR STR LF (GAUZE/BANDAGES/DRESSINGS) ×2 IMPLANT
BNDG ELASTIC 6X5.8 VLCR STR LF (GAUZE/BANDAGES/DRESSINGS) ×2 IMPLANT
BNDG ESMARK 6X9 LF (GAUZE/BANDAGES/DRESSINGS)
BNDG GAUZE DERMACEA FLUFF 4 (GAUZE/BANDAGES/DRESSINGS) ×2 IMPLANT
BNDG GZE DERMACEA 4 6PLY (GAUZE/BANDAGES/DRESSINGS) ×1
BRUSH SCRUB EZ PLAIN DRY (MISCELLANEOUS) ×4 IMPLANT
CANISTER SUCT 3000ML PPV (MISCELLANEOUS) ×2 IMPLANT
CAP LOCK NCB (Cap) IMPLANT
COVER SURGICAL LIGHT HANDLE (MISCELLANEOUS) ×2 IMPLANT
CUFF TOURN SGL QUICK 34 (TOURNIQUET CUFF)
CUFF TRNQT CYL 34X4.125X (TOURNIQUET CUFF) ×2 IMPLANT
DRAPE C-ARM 42X72 X-RAY (DRAPES) ×2 IMPLANT
DRAPE C-ARMOR (DRAPES) ×2 IMPLANT
DRAPE HALF SHEET 40X57 (DRAPES) IMPLANT
DRAPE INCISE IOBAN 66X45 STRL (DRAPES) ×2 IMPLANT
DRAPE U-SHAPE 47X51 STRL (DRAPES) ×2 IMPLANT
DRILL BIT 4.3 (BIT) ×1
DRSG ADAPTIC 3X8 NADH LF (GAUZE/BANDAGES/DRESSINGS) ×2 IMPLANT
DRSG MEPITEL 4X7.2 (GAUZE/BANDAGES/DRESSINGS) IMPLANT
ELECT REM PT RETURN 9FT ADLT (ELECTROSURGICAL) ×1
ELECTRODE REM PT RTRN 9FT ADLT (ELECTROSURGICAL) ×2 IMPLANT
GAUZE PAD ABD 8X10 STRL (GAUZE/BANDAGES/DRESSINGS) ×4 IMPLANT
GAUZE SPONGE 4X4 12PLY STRL (GAUZE/BANDAGES/DRESSINGS) ×2 IMPLANT
GLOVE BIO SURGEON STRL SZ7.5 (GLOVE) ×2 IMPLANT
GLOVE BIO SURGEON STRL SZ8 (GLOVE) ×2 IMPLANT
GLOVE BIOGEL PI IND STRL 7.5 (GLOVE) ×2 IMPLANT
GLOVE BIOGEL PI IND STRL 8 (GLOVE) ×2 IMPLANT
GLOVE SURG ORTHO LTX SZ7.5 (GLOVE) ×4 IMPLANT
GOWN STRL REUS W/ TWL LRG LVL3 (GOWN DISPOSABLE) ×4 IMPLANT
GOWN STRL REUS W/ TWL XL LVL3 (GOWN DISPOSABLE) ×2 IMPLANT
GOWN STRL REUS W/TWL LRG LVL3 (GOWN DISPOSABLE) ×2
GOWN STRL REUS W/TWL XL LVL3 (GOWN DISPOSABLE) ×1
IMMOBILIZER KNEE 22 UNIV (SOFTGOODS) ×2 IMPLANT
K-WIRE 2.0 (WIRE) ×1
K-WIRE FXSTD 280X2XNS SS (WIRE) ×1
KIT BASIN OR (CUSTOM PROCEDURE TRAY) ×2 IMPLANT
KIT TURNOVER KIT B (KITS) ×2 IMPLANT
KWIRE FXSTD 280X2XNS SS (WIRE) IMPLANT
NDL HYPO 18GX1.5 BLUNT FILL (NEEDLE) IMPLANT
NDL SUT 6 .5 CRC .975X.05 MAYO (NEEDLE) IMPLANT
NEEDLE HYPO 18GX1.5 BLUNT FILL (NEEDLE) ×1 IMPLANT
NEEDLE MAYO TAPER (NEEDLE)
NS IRRIG 1000ML POUR BTL (IV SOLUTION) ×2 IMPLANT
PACK ORTHO EXTREMITY (CUSTOM PROCEDURE TRAY) ×2 IMPLANT
PAD ARMBOARD 7.5X6 YLW CONV (MISCELLANEOUS) ×4 IMPLANT
PAD CAST 4YDX4 CTTN HI CHSV (CAST SUPPLIES) ×2 IMPLANT
PADDING CAST COTTON 4X4 STRL (CAST SUPPLIES) ×1
PADDING CAST COTTON 6X4 STRL (CAST SUPPLIES) ×2 IMPLANT
PLATE LAT PROX 3H TIB 5H LEFT (Miscellaneous) IMPLANT
SCREW NCB 4.0MX30M (Screw) IMPLANT
SCREW NCB 4.0MX65M (Screw) IMPLANT
SCREW NCB 4.0X20MM (Screw) IMPLANT
SCREW NCB 4.0X24MM (Screw) IMPLANT
SCREW NCB 4.0X26MM (Screw) IMPLANT
SCREW NCB 4X3 4X70 (Screw) IMPLANT
SPONGE T-LAP 18X18 ~~LOC~~+RFID (SPONGE) ×2 IMPLANT
STAPLER VISISTAT 35W (STAPLE) ×2 IMPLANT
STOCKINETTE IMPERVIOUS LG (DRAPES) ×2 IMPLANT
SUCTION TUBE FRAZIER 10FR DISP (SUCTIONS) ×2 IMPLANT
SUT ETHILON 2 0 FS 18 (SUTURE) IMPLANT
SUT PROLENE 0 CT 2 (SUTURE) ×4 IMPLANT
SUT VIC AB 0 CT1 27 (SUTURE) ×1
SUT VIC AB 0 CT1 27XBRD ANBCTR (SUTURE) ×2 IMPLANT
SUT VIC AB 1 CT1 27 (SUTURE)
SUT VIC AB 1 CT1 27XBRD ANBCTR (SUTURE) ×2 IMPLANT
SUT VIC AB 2-0 CT1 27 (SUTURE) ×1
SUT VIC AB 2-0 CT1 TAPERPNT 27 (SUTURE) ×4 IMPLANT
TOWEL GREEN STERILE (TOWEL DISPOSABLE) ×4 IMPLANT
TOWEL GREEN STERILE FF (TOWEL DISPOSABLE) ×2 IMPLANT
TRAY FOLEY MTR SLVR 16FR STAT (SET/KITS/TRAYS/PACK) IMPLANT
TUBE CONNECTING 12X1/4 (SUCTIONS) ×2 IMPLANT
WATER STERILE IRR 1000ML POUR (IV SOLUTION) ×4 IMPLANT
YANKAUER SUCT BULB TIP NO VENT (SUCTIONS) ×2 IMPLANT

## 2023-01-18 NOTE — Anesthesia Procedure Notes (Signed)
Procedure Name: Intubation Date/Time: 01/18/2023 8:49 AM  Performed by: Cy Blamer, CRNAPre-anesthesia Checklist: Patient identified, Emergency Drugs available, Suction available and Patient being monitored Patient Re-evaluated:Patient Re-evaluated prior to induction Oxygen Delivery Method: Circle system utilized Preoxygenation: Pre-oxygenation with 100% oxygen Induction Type: IV induction Ventilation: Mask ventilation without difficulty Laryngoscope Size: Miller and 2 Grade View: Grade II Tube type: Oral Tube size: 7.0 mm Number of attempts: 1 Airway Equipment and Method: Stylet and Bite block Placement Confirmation: ETT inserted through vocal cords under direct vision, positive ETCO2 and breath sounds checked- equal and bilateral Secured at: 20 cm Tube secured with: Tape Dental Injury: Teeth and Oropharynx as per pre-operative assessment

## 2023-01-18 NOTE — TOC Progression Note (Signed)
Transition of Care [facility]) - Progression Note    Patient Details  Name: Margaret [name] MRN: [id number] Date of Birth: 12/20/1955  [facility]) CM/SW Contact  Epifanio Lesches, RN Phone Number: 01/18/2023, 12:53 PM  Clinical Narrative:       - left lateral tibial plateau fx, s/p ORIF, 7/1 PT/OT evaluations pending .... Per pt has to steps to enter into home. DME: crutches. Supportive husband / sons x2 / daughter.  TOC team following for needs.  Expected Discharge Plan: Home w Home Health Services Barriers to Discharge: Continued Medical Work up  Expected Discharge Plan and Services       Living arrangements for the past 2 months: Single Family Home                                       Social Determinants of Health (SDOH) Interventions SDOH Screenings   Food Insecurity: No Food Insecurity (01/15/2023)  Housing: Low Risk  (01/15/2023)  Transportation Needs: No Transportation Needs (01/15/2023)  Utilities: Not At Risk (01/15/2023)  Tobacco Use: Low Risk  (01/18/2023)    Readmission Risk Interventions     No data to display

## 2023-01-18 NOTE — Consult Note (Signed)
Orthopaedic Trauma Service (OTS) Consultation   Patient ID: KAELE OPLINGER MRN: [id number] DOB/AGE: June 19, 1956 67 y.o.   Reason for Consult: Left tibial plateau fracture Referring Physician: Gean Birchwood, MD  HPI: Margaret [name] is an 67 y.o. female who sustained ground level fall. Periorbital ecchymosis but no fracture by report. Pain is currently well controlled in immobilizer, aching and dull, sharp and severe with motion, without associated distal tingling or numbness, and improved with narcotics. Medial exit of fracture but intra-articular on the lateral side.   Past Medical History:  Diagnosis Date   Allergy    seasonal   Hyperlipidemia    Palpitations    h/o, probably benign   Reflux    normal EGD 2009   SVT (supraventricular tachycardia)     Past Surgical History:  Procedure Laterality Date   CESAREAN SECTION  1992   COLONOSCOPY  06/2005   Dr. Sherin Quarry; 11/2014 Dr. Loreta Ave   ESOPHAGOGASTRODUODENOSCOPY  2009   unremarkable    Family History  Problem Relation Age of Onset   Down syndrome Brother    GER disease Brother    Diabetes Father    Heart disease Father    Colon polyps Mother    Hypertension Mother    Hyperlipidemia Mother    Cancer Mother        skin   Urolithiasis Sister    Healthy Brother    Autism Son    Autism Son    Cancer Maternal Grandmother        colon   Colon cancer Maternal Grandmother     Social History:  reports that she has never smoked. She has never used smokeless tobacco. She reports current alcohol use. She reports that she does not use drugs.  Allergies:  Allergies  Allergen Reactions   Aspirin Other (See Comments)    Cannot take due to ulcer.; EGD from 2009 reviewed--normal, no ulcer   Dairy Aid [Tilactase] Other (See Comments)    Headaches.   Gluten Meal Other (See Comments)    Makes her feel foggy.   Penicillins Other (See Comments)    Facial swelling and breathing trouble.   Sulfa  Antibiotics Nausea Only   Zinc Trace Metal Additives [Zinc]     "Earrings make ears bleed"   Prednisone Palpitations    Medications: Prior to Admission:  Medications Prior to Admission  Medication Sig Dispense Refill Last Dose   b complex vitamins tablet Take 1 tablet by mouth as needed. Reported on 09/02/2015   Past Week   calcium carbonate (TUMS - DOSED IN MG ELEMENTAL CALCIUM) 500 MG chewable tablet Chew 1 tablet by mouth as needed for indigestion or heartburn.   01/14/2023   Cholecalciferol (VITAMIN D) 1000 UNITS capsule Take 1,000 Units by mouth daily. Reported on 09/02/2015   01/14/2023   Coenzyme Q10 (CO Q 10 PO) Take 50 mg by mouth daily. Reported on 09/02/2015   01/14/2023   Magnesium 200 MG TABS Take 200 mg by mouth 2 (two) times daily. Per patient taking 400 mg daily   01/14/2023   melatonin 1 MG TABS tablet Take 1 mg by mouth at bedtime.   01/14/2023   NASAL SALINE NA Place into the nose daily.   Past Week   OMEGA 3-6-9 FATTY ACIDS PO Take 1 capsule by mouth 2 (two) times daily.   01/14/2023   psyllium (METAMUCIL) 58.6 % powder Take 2 packets by mouth daily.   01/14/2023  vitamin C (ASCORBIC ACID) 500 MG tablet Take 500 mg by mouth daily.   01/14/2023    Results for orders placed or performed during the hospital encounter of 01/15/23 (from the past 48 hour(s))  CBC     Status: None   Collection Time: 01/18/23  5:04 AM  Result Value Ref Range   WBC 9.2 4.0 - 10.5 K/uL   RBC 4.12 3.87 - 5.11 MIL/uL   Hemoglobin 12.8 12.0 - 15.0 g/dL   HCT 82.9 56.2 - 13.0 %   MCV 89.8 80.0 - 100.0 fL   MCH 31.1 26.0 - 34.0 pg   MCHC 34.6 30.0 - 36.0 g/dL   RDW 86.5 78.4 - 69.6 %   Platelets 240 150 - 400 K/uL   nRBC 0.0 0.0 - 0.2 %    Comment: Performed at [facility], [address] [place], [place] [address]  Basic metabolic panel     Status: Abnormal   Collection Time: 01/18/23  5:04 AM  Result Value Ref Range   Sodium 132 (L) 135 - 145 mmol/L   Potassium 3.2 (L) 3.5 - 5.1 mmol/L    Chloride 98 98 - 111 mmol/L   CO2 23 22 - 32 mmol/L   Glucose, Bld 112 (H) 70 - 99 mg/dL    Comment: Glucose reference range applies only to samples taken after fasting for at least 8 hours.   BUN 6 (L) 8 - 23 mg/dL   Creatinine, Ser 4.13 0.44 - 1.00 mg/dL   Calcium 8.7 (L) 8.9 - 10.3 mg/dL   GFR, Estimated >24 >40 mL/min    Comment: (NOTE) Calculated using the CKD-EPI Creatinine Equation (2021)    Anion gap 11 5 - 15    Comment: Performed at [facility], [address], [place], [place] [address]  Glucose, capillary     Status: None   Collection Time: 01/18/23  7:02 AM  Result Value Ref Range   Glucose-Capillary 99 70 - 99 mg/dL    Comment: Glucose reference range applies only to samples taken after fasting for at least 8 hours.    No results found.  Intake/Output      06/30 0701 07/01 0700 07/01 0701 07/02 0700   P.O.     I.V. (mL/kg) 172 (2.5)    Total Intake(mL/kg) 172 (2.5)    Net +172         Urine Occurrence 3 x       ROS No recent fever, bleeding abnormalities, urologic dysfunction, GI problems, or weight gain.  Blood pressure 133/71, pulse 71, temperature 98.1 F (36.7 C), temperature source Oral, resp. rate 18, height 5\' 2"  (1.575 m), weight 68.7 kg, last menstrual period 07/20/2006, SpO2 93 %. Physical Exam [place] eyes RRR Lungs without wheezing LLE Dressing intact, clean, dry  Edema/ swelling controlled  Sens: DPN, SPN, TN intact  Motor: EHL, FHL, and lessor toe ext and flex all intact grossly  Brisk cap refill, warm to touch Coordination and balance: could not observe  Assessment/Plan:  Left tibial plateau fracture  Given the location and complexity of the fracture (Medial exit of fracture but intra-articular on the lateral side), Dr. Turner Daniels asserted this was outside his scope of practice and that it would be in the best interest of the patient to have these injuries evaluated and treated by a fellowship trained orthopaedic traumatologist.  Consequently, I was consulted to provide further evaluation and management.  The risks and benefits of surgery were discussed with  the patient, including the possibility of infection, nerve injury, vessel injury, wound breakdown, arthritis, symptomatic hardware, DVT/ PE, loss of motion, malunion, nonunion, and need for further surgery among others..  These risks were acknowledged and consent provided to proceed.    Margaret Galas, MD [facility], [facility] [phone]  01/18/2023, 8:18 AM  [facility] [address] [place] [address] [phone] [phone] (F)    After 5pm and on the weekends please log on to Amion, go to orthopaedics and the look under the [facility] Call for the provider(s) on call. You can also call our office at [phone] and then follow the prompts to be connected to the call team. '

## 2023-01-18 NOTE — Anesthesia Preprocedure Evaluation (Addendum)
Anesthesia Evaluation  Patient identified by MRN, date of birth, ID band Patient awake    Reviewed: Allergy & Precautions, NPO status , Patient's Chart, lab work & pertinent test results  Airway Mallampati: II  TM Distance: >3 FB Neck ROM: Full    Dental no notable dental hx. (+) Dental Advisory Given, Teeth Intact   Pulmonary neg pulmonary ROS   Pulmonary exam normal breath sounds clear to auscultation       Cardiovascular Normal cardiovascular exam+ Valvular Problems/Murmurs AI  Rhythm:Irregular Rate:Normal  Echo 02/2022  1. Left ventricular ejection fraction, by estimation, is 60 to 65%. The left ventricle has normal function. The left ventricle has no regional wall motion abnormalities. Left ventricular diastolic parameters were normal.   2. Right ventricular systolic function is normal. The right ventricular size is normal. Tricuspid regurgitation signal is inadequate for assessing PA pressure.   3. The mitral valve is normal in structure. Trivial mitral valve regurgitation. No evidence of mitral stenosis.   4. The aortic valve is calcified. Aortic valve regurgitation is mild to moderate. Aortic valve sclerosis is present, with no evidence of aortic valve stenosis. Aortic regurgitation PHT measures 453 msec.   5. The inferior vena cava is normal in size with greater than 50% respiratory variability, suggesting right atrial pressure of 3 mmHg.      Neuro/Psych negative neurological ROS     GI/Hepatic Neg liver ROS,GERD  Controlled,,  Endo/Other  negative endocrine ROS    Renal/GU negative Renal ROS     Musculoskeletal negative musculoskeletal ROS (+)    Abdominal   Peds  Hematology negative hematology ROS (+)   Anesthesia Other Findings   Reproductive/Obstetrics                              Anesthesia Physical Anesthesia Plan  ASA: 3  Anesthesia Plan: General   Post-op Pain  Management: Ofirmev IV (intra-op)*   Induction: Intravenous  PONV Risk Score and Plan: 4 or greater and Ondansetron, Dexamethasone and Treatment may vary due to age or medical condition  Airway Management Planned: Oral ETT  Additional Equipment:   Intra-op Plan:   Post-operative Plan: Extubation in OR  Informed Consent: I have reviewed the patients History and Physical, chart, labs and discussed the procedure including the risks, benefits and alternatives for the proposed anesthesia with the patient or authorized representative who has indicated his/her understanding and acceptance.       Plan Discussed with: CRNA  Anesthesia Plan Comments:          Anesthesia Quick Evaluation

## 2023-01-18 NOTE — Transfer of Care (Signed)
Immediate Anesthesia Transfer of Care Note  Patient: Margaret [name]  Procedure(s) Performed: OPEN REDUCTION INTERNAL FIXATION (ORIF) TIBIAL PLATEAU (Left: Knee)  Patient Location: PACU  Anesthesia Type:General  Level of Consciousness: awake, alert , and oriented  Airway & Oxygen Therapy: Patient Spontanous Breathing and Patient connected to nasal cannula oxygen  Post-op Assessment: Report given to RN, Post -op Vital signs reviewed and stable, Patient moving all extremities X 4, and Patient able to stick tongue midline  Post vital signs: Reviewed  Last Vitals:  Vitals Value Taken Time  BP 156/74   Temp 97.8   Pulse 110 01/18/23 1017  Resp 30 01/18/23 1017  SpO2 95 % 01/18/23 1017  Vitals shown include unvalidated device data.  Last Pain:  Vitals:   01/18/23 0729  TempSrc: Oral  PainSc: 3       Patients Stated Pain Goal: 3 (01/17/23 1114)  Complications: No notable events documented.

## 2023-01-18 NOTE — Progress Notes (Signed)
Orthopaedic Trauma Service Progress Note  Patient ID: Margaret Margaret [name] MRN: [id number] DOB/AGE: 1956/04/14 67 y.o.  Subjective:  Margaret [name] of surgery note  Pt requested Ortho see her today after surgery.  See Nurse Margaret Margaret [name]'s progress note from 97   Spoke with pt for about 30 min regarding plan of care and expectations. Nurse Margaret Margaret [name] was present as well  Discussed that pain goal should be 2-3/10 and that a 0 is unrealistic immediately post op Discussed WB restrictions and therapy expectations    ROS As above  Objective:   VITALS:   Vitals:   01/18/23 1100 01/18/23 1113 01/18/23 1129 01/18/23 1540  BP: (!) 144/69 (!) 154/70 (!) 130/93 (!) 144/74  Pulse: 89 82 90 (!) 110  Resp: (!) 21 11 18    Temp:  97.9 F (36.6 C) 97.9 F (36.6 C) 97.8 F (36.6 C)  TempSrc:   Oral Oral  SpO2: 98% 98% 98% 94%  Weight:      Height:        Estimated body mass index is 27.7 kg/m as calculated from the following:   Height as of this encounter: 5\' 2"  (1.575 m).   Weight as of this encounter: 68.7 kg.   Intake/Output      06/30 0701 07/01 0700 07/01 0701 07/02 0700   P.O.     I.V. (mL/kg) 172 (2.5) 1404.2 (20.4)   IV Piggyback  100   Total Intake(mL/kg) 172 (2.5) 1504.2 (21.9)   Urine (mL/kg/hr)  375 (0.5)   Blood  30   Total Output  405   Net +172 +1099.2        Urine Occurrence 3 x 1 x     LABS  Results for orders placed or performed during the hospital encounter of 01/15/23 (from the past 24 hour(s))  CBC     Status: None   Collection Time: 01/18/23  5:04 AM  Result Value Ref Range   WBC 9.2 4.0 - 10.5 K/uL   RBC 4.12 3.87 - 5.11 MIL/uL   Hemoglobin 12.8 12.0 - 15.0 g/dL   HCT 40.9 81.1 - 91.4 %   MCV 89.8 80.0 - 100.0 fL   MCH 31.1 26.0 - 34.0 pg   MCHC 34.6 30.0 - 36.0 g/dL   RDW 78.2 95.6 - 21.3 %   Platelets 240 150 - 400 K/uL   nRBC 0.0 0.0 - 0.2 %  Basic metabolic panel      Status: Abnormal   Collection Time: 01/18/23  5:04 AM  Result Value Ref Range   Sodium 132 (L) 135 - 145 mmol/L   Potassium 3.2 (L) 3.5 - 5.1 mmol/L   Chloride 98 98 - 111 mmol/L   CO2 23 22 - 32 mmol/L   Glucose, Bld 112 (H) 70 - 99 mg/dL   BUN 6 (L) 8 - 23 mg/dL   Creatinine, Ser 0.86 0.44 - 1.00 mg/dL   Calcium 8.7 (L) 8.9 - 10.3 mg/dL   GFR, Estimated >57 >84 mL/min   Anion gap 11 5 - 15  Surgical pcr screen     Status: None   Collection Time: 01/18/23  6:18 AM   Specimen: Nasal Mucosa; Nasal Swab  Result Value Ref Range   MRSA, PCR NEGATIVE NEGATIVE   Staphylococcus aureus NEGATIVE NEGATIVE  Glucose, capillary  Status: None   Collection Time: 01/18/23  7:02 AM  Result Value Ref Range   Glucose-Capillary 99 70 - 99 mg/dL     PHYSICAL EXAM:   Gen: sitting up in bed, looks very comfortable, NAD  Lungs: unlabored  Cardiac: reg  Ext:       Left Lower Extremity   Ace and dressing on loosely (RN loosened earlier as directed by myself)  Minimal swelling  Compartments are soft   No pain out of proportion with passive stretch   DPN, SPN, TN sensation intact  Ankle flexion, extension, inversion and eversion intact  EHL, FHL, lesser toe motor intact  + DP pulse  Good perfusion distally   No concerning findings on exam    Assessment/Plan: Margaret [name] of Surgery   Principal Problem:   Left tibial fracture Active Problems:   Pure hypercholesterolemia   SVT (supraventricular tachycardia)   Mild aortic regurgitation   Anti-infectives (From admission, onward)    Start     Dose/Rate Route Frequency Ordered Stop   01/18/23 1500  ceFAZolin (ANCEF) IVPB 1 g/50 mL premix        1 g 100 mL/hr over 30 Minutes Intravenous Every 6 hours 01/18/23 1127 01/19/23 0859   01/18/23 0830  ceFAZolin (ANCEF) 2-4 GM/100ML-% IVPB       Note to Pharmacy: Margaret Margaret [name] L: cabinet override      01/18/23 0830 01/18/23 2044     .  POD/HD#: 65  68 y/o female s/p ground level fall with  closed L bicondylar tibial plateau fracture   -Closed L bicondylar tibial plateau fracture s/p ORIF  Weightbearing NWB L leg for 6-8 weeks    ROM/Activity   Unrestricted ROM L Knee and ankle      Wound care   Daily dressing changes starting on 01/20/2023  PT- please teach HEP for L knee ROM- AROM, PROM. Prone exercises as well. No ROM restrictions.  Quad sets, SLR, LAQ, SAQ, heel slides, stretching, prone flexion and extension  Ankle theraband program, heel cord stretching, toe towel curls, etc  No pillows under bend of knee when at rest, ok to place under heel to help work on extension. Can also use zero knee bone foam if available DO NOT LET KNEE REST IN FLEXION!!!!!   - Pain management:  Multimodal   Pt cannot take NSAIDs/ASA  per her report   Pt states that tylenol causes her severe GI distress too      We discussed that it would be advisable to use low dose narcotics and muscle relaxers to control her pain and to maximize her participation with therapies       Ice therapy as well   - ABL anemia/Hemodynamics  Monitor   - Medical issues   Per primary   - DVT/PE prophylaxis:  Lovenox   Recommend eliquis 2.5 mg po BID x 30 days - ID:   Periop abx  - Metabolic Bone Disease:  Fragility fracture    Check vitamin d   - Impediments to fracture healing:  Poor bone quality   - Dispo:  Therapy evals   Dc venue TBD     Margaret Latin, PA-C [phone] (C) 01/18/2023, 5:17 PM  [facility] [address] [place] [address] [phone] [phone] (F)    After 5pm and on the weekends please log on to Amion, go to orthopaedics and the look under the [facility] Call for the provider(s) on call. You can also call  our office at [phone] and then follow the prompts to be connected to the call team.  Patient ID: Margaret Margaret [name], female   DOB: Feb 11, 1956, 67 y.o.   MRN: [id number]

## 2023-01-18 NOTE — Plan of Care (Signed)
  Problem: Pain Managment: Goal: General experience of comfort will improve Outcome: Progressing   Problem: Safety: Goal: Ability to remain free from injury will improve Outcome: Progressing   Problem: Skin Integrity: Goal: Risk for impaired skin integrity will decrease Outcome: Progressing   

## 2023-01-18 NOTE — Progress Notes (Signed)
Pt returned to room 5N07 via bed after surgery. Received report from JJ, RN in PACU. See assessment.

## 2023-01-18 NOTE — TOC CAGE-AID Note (Signed)
Transition of Care [facility]) - CAGE-AID Screening   Patient Details  Name: Margaret [name] MRN: [id number] Date of Birth: May 21, 1956   Hewitt Shorts, RN Trauma Response Nurse Phone Number: [phone] 01/18/2023, 6:16 PM     CAGE-AID Screening:    Have You Ever Felt You Ought to Cut Down on Your Drinking or Drug Use?: No Have People Annoyed You By Critizing Your Drinking Or Drug Use?: No Have You Felt Bad Or Guilty About Your Drinking Or Drug Use?: No Have You Ever Had a Drink or Used Drugs First Thing In The Morning to Steady Your Nerves or to Get Rid of a Hangover?: No CAGE-AID Score: 0  Substance Abuse Education Offered: No

## 2023-01-18 NOTE — Progress Notes (Signed)
1305: Pt c/o numbness and "cutting circulation off" to LLE. States "the wrap is too tight". LLE warm, dry, and <2 sec cap refill. Pt able to wiggle toes. Montez Morita, PA made aware, stated okay for RN to loosen ace wrap and to come to bedside to evaluate per pt request.  1352: RN loosened ace wrap, 2+ DP pulse. Gauze drsg remains in place. No drainage noted. Reassurance provided to pt. Pt stated relief and continues to request ortho to evaluate drsg. Montez Morita, PA aware.

## 2023-01-18 NOTE — Progress Notes (Signed)
Upon return to 5N07 after surgical intervention, pt tearful and began yelling at RN to reposition her left ankle. RN repositioned left ankle multiple times per pt request. Pt yelled repositioning needed to be changed again and when RN clarified how pt wanted left ankle repositioned, pt shook left index finger at RN and yelled, "be quiet!" to RN. RN listened to pt concerns and Janique, Facilities manager, made aware and at bedside to speak with pt.

## 2023-01-18 NOTE — Progress Notes (Signed)
PROGRESS NOTE   JOSEFA DEARY  ZOX:[id number]    DOB: [birth year]    DOA: 01/15/2023  PCP: Deatra James, MD     Brief Narrative:  67 year old married female with PMH of HLD, SVT, GERD, presented to the ED after sustaining a mechanical fall at a grocery store sustaining multiple injuries, most significantly to her left knee and left frontal/periorbital area.  No syncope.  Admitted for mechanical fall and left knee acute nondepressed fracture of lateral tibial plateau with a large layering knee joint lipohemarthrosis.  EDP consulted orthopedics who recommended nonoperative/conservative management and outpatient follow-up.  However patient unable to manage with crutches and was admitted for observation, pain management and further evaluation.  As per orthopedic input, patient to transfer to [facility] possibly on 6/30 for surgical intervention on 7/1.   Assessment & Plan:  Principal Problem:   Left tibial fracture Active Problems:   Pure hypercholesterolemia   SVT (supraventricular tachycardia)   Mild aortic regurgitation   Acute nondepressed fracture of left lateral tibial plateau with a large layering knee lipo hemarthrosis: - Sustained after mechanical fall PTA - CT of the left knee: Acute nondepressed fracture of the lateral tibial plateau and Large layering knee joint lipohemarthrosis. - CT head, C-spine without acute findings.  Incidental findings. - X-ray of the right shoulder and hip with left pelvis negative. - EDP discussed with Dr. Yevette Edwards, orthopedics who had recommended knee immobilizer, crutches with outpatient follow-up but patient was unable to do well with crutches or walker and was admitted. - Consulted Dr. Turner Daniels, Orthopedics.  His input much appreciated.  Indicates minimally displaced, but unstable Schatzker 4 fracture of the left tibial plateau.  He has consulted Dr. Carola Frost, orthopedic trauma with plans for ORIF on 7/1 -Patient has good exercise tolerance, asymptomatic of  cardiorespiratory symptoms, may proceed with indicated surgery with usual close preop monitoring.  HLD: - Continue prior home meds/fish oil at discharge.  SVT: - Reportedly did not tolerate metoprolol.  Follows with outpatient cardiology. -As per Dr. Shari Prows, cardiology office note from 09/04/2022: SVT well-controlled with few breakthrough episodes.  Did not tolerate metoprolol due to fatigue but has been managing well with conservative management including hydration, stress relief and exercise.  TTE with normal BiV function and no significant valve disease.  Mild to moderate AR.   Hypokalemia -replete    DVT prophylaxis: enoxaparin (LOVENOX) injection 40 mg Start: 01/19/23 0800 SCDs Start: 01/18/23 1128 SCDs Start: 01/15/23 1752     Code Status: Full Code:  Family Communication: husband Disposition:  Status is: Inpatient     Consultants:   Orthopedics    Subjective:  Tearful, just back from the OR in pain  Objective:   Vitals:   01/18/23 1045 01/18/23 1100 01/18/23 1113 01/18/23 1129  BP: (!) 148/79 (!) 144/69 (!) 154/70 (!) 130/93  Pulse: 87 89 82 90  Resp: (!) 9 (!) 21 11 18   Temp:   97.9 F (36.6 C) 97.9 F (36.6 C)  TempSrc:    Oral  SpO2: 93% 98% 98% 98%  Weight:      Height:         General: Appearance:     Overweight female in no acute distress     Lungs:     Clear to auscultation bilaterally, respirations unlabored  Heart:    Normal heart rate.   MS:   All extremities are intact.   Neurologic:   Awake, alert, oriented x 3. No apparent focal neurological  defect.        Data Reviewed:   I have personally reviewed following labs and imaging studies   CBC: Recent Labs  Lab 01/15/23 1826 01/18/23 0504  WBC 13.7* 9.2  HGB 14.2 12.8  HCT 41.5 37.0  MCV 91.4 89.8  PLT 274 240    Basic Metabolic Panel: Recent Labs  Lab 01/15/23 1826 01/18/23 0504  NA 133* 132*  K 3.5 3.2*  CL 101 98  CO2 22 23  GLUCOSE 145* 112*  BUN 12  6*  CREATININE 0.56 0.64  CALCIUM 9.2 8.7*    Liver Function Tests: Recent Labs  Lab 01/15/23 1826  AST 21  ALT 22  ALKPHOS 70  BILITOT 0.8  PROT 7.3  ALBUMIN 4.0    CBG: Recent Labs  Lab 01/18/23 0702  GLUCAP 99    Microbiology Studies:   Recent Results (from the past 240 hour(s))  Surgical pcr screen     Status: None   Collection Time: 01/18/23  6:18 AM   Specimen: Nasal Mucosa; Nasal Swab  Result Value Ref Range Status   MRSA, PCR NEGATIVE NEGATIVE Final   Staphylococcus aureus NEGATIVE NEGATIVE Final    Comment: (NOTE) The Xpert SA Assay (FDA approved for NASAL specimens in patients 64 years of age and older), is one component of a comprehensive surveillance program. It is not intended to diagnose infection nor to guide or monitor treatment. Performed at [facility], [address], [place], [place] [address]     Radiology Studies:  DG Knee Complete 4 Views Left  Result Date: 01/18/2023 CLINICAL DATA:  Elective surgery. ORIF left tibial plateau fracture. EXAM: LEFT KNEE - COMPLETE 4+ VIEW COMPARISON:  Left knee radiographs 01/15/2023, CT left knee 01/15/2023 FINDINGS: Images were performed intraoperatively without the presence of a radiologist. Interval lateral plate and screw fixation of the previously seen proximal tibial fracture predominantly involving the lateral tibial spine through the medial proximal metaphysis. No hardware complication is seen. Total fluoroscopy images: 6 Total fluoroscopy time: 38 seconds Total dose: Radiation Exposure Index (as provided by the fluoroscopic device): 2.68 mGy air Kerma Please see intraoperative findings for further detail. IMPRESSION: Intraoperative images during ORIF of proximal tibial fracture. Electronically Signed   By: Neita Garnet M.D.   On: 01/18/2023 10:31   DG C-Arm 1-60 Min-No Report  Result Date: 01/18/2023 Fluoroscopy was utilized by the requesting physician.  No radiographic interpretation.     Scheduled Meds:    cholecalciferol  1,000 Units Oral Daily   docusate sodium  100 mg Oral BID   [START ON 01/19/2023] enoxaparin (LOVENOX) injection  40 mg Subcutaneous Q24H   HYDROmorphone       melatonin  1.5 mg Oral QHS    Continuous Infusions:    ceFAZolin      ceFAZolin (ANCEF) IV     lactated ringers 75 mL/hr at 01/18/23 0029     LOS: 2 days     Joseph Art   To contact the attending provider between 7A-7P or the covering provider during after hours 7P-7A, please log into the web site [email or web address] and access using universal [place] password for that web site. If you do not have the password, please call the hospital operator.  01/18/2023, 11:32 AM

## 2023-01-18 NOTE — Evaluation (Signed)
Physical Therapy Evaluation Patient Details Name: Margaret [name] MRN: [id number] DOB: 1956/04/29 Today's Date: 01/18/2023  History of Present Illness  Patient is 67 y.o. female s/p ORIF for Lt tibial plateau fracture secondary to fall. Pt also has left periorbital hematoma due to fall, CT of head negative for acute injury. PMH significant for HLD, SVT, GERD.   Clinical Impression  Margaret [name] is a 67 y.o. female POD 0 s/p Lt ORIF for tib plateau fracture. Patient reports independence with mobility at baseline. Patient is now limited by functional impairments (see PT problem list below) and requires min assist for bed mobility and min guard for transfers with RW. Pain limited progression to gait today. Pt educated on benefits of ice for pain management. Ace wrap re-wrapped for pain control with adjustment for dorsiflexion wrap at ankle to help with ankle/lower shin pain. Pt reported some relief. Patient will benefit from continued skilled PT interventions to address impairments and progress towards PLOF. Acute PT will follow to progress mobility as able.         Assistance Recommended at Discharge Frequent or constant Supervision/Assistance  If plan is discharge home, recommend the following:  Can travel by private vehicle  A lot of help with walking and/or transfers;A little help with bathing/dressing/bathroom;Assistance with cooking/housework;Direct supervision/assist for medications management;Assist for transportation;Help with stairs or ramp for entrance        Equipment Recommendations Rolling walker (2 wheels)  Recommendations for Other Services       Functional Status Assessment Patient has had a recent decline in their functional status and demonstrates the ability to make significant improvements in function in a reasonable and predictable amount of time.     Precautions / Restrictions Precautions Precautions: Fall Restrictions Weight Bearing Restrictions:  Yes LLE Weight Bearing: Non weight bearing      Mobility  Bed Mobility Overal bed mobility: Needs Assistance Bed Mobility: Supine to Sit, Sit to Supine     Supine to sit: Min assist, HOB elevated Sit to supine: Min assist   General bed mobility comments: pt required light assist to bring Lt LE off/on EOB with supine<>sit. pt limited by pain and friction.    Transfers Overall transfer level: Needs assistance Equipment used: Rolling walker (2 wheels) Transfers: Sit to/from Stand Sit to Stand: Min guard           General transfer comment: guarding for safety, pt able to maintain NWB on Lt LE with rise and lower from EOB. pt declined use of BSC or gait.    Ambulation/Gait               General Gait Details: deferred due to pain  Stairs            Wheelchair Mobility     Tilt Bed    Modified Rankin (Stroke Patients Only)       Balance Overall balance assessment: Needs assistance Sitting-balance support: Feet supported, Bilateral upper extremity supported Sitting balance-Leahy Scale: Good     Standing balance support: Reliant on assistive device for balance, During functional activity, Bilateral upper extremity supported Standing balance-Leahy Scale: Poor                               Pertinent Vitals/Pain Pain Assessment Pain Assessment: 0-10 Pain Location: Lt lower leg Pain Descriptors / Indicators: Aching, Discomfort, Operative site guarding Pain Intervention(s): Limited activity within patient's tolerance, Monitored during session, Premedicated before session,  Repositioned, Ice applied    Home Living Family/patient expects to be discharged to:: Private residence Living Arrangements: Spouse/significant other;Children   Type of Home: House Home Access: Stairs to enter Entrance Stairs-Rails: Doctor, general practice of Steps: 3 Alternate Level Stairs-Number of Steps: 12-18 Home Layout: Two level;Bed/bath upstairs;Able  to live on main level with bedroom/bathroom (shower is upstairs, tubs on main level) Home Equipment: None (shower seat from mother-in-law) Additional Comments: two sons and spouse at home, daughter is coming home this summer from school in [place].    Prior Function Prior Level of Function : Independent/Modified Independent;Driving (assist with family, two adult sons on autism spectrum)                     Hand Dominance   Dominant Hand: Right    Extremity/Trunk Assessment   Upper Extremity Assessment Upper Extremity Assessment: Overall WFL for tasks assessed    Lower Extremity Assessment Lower Extremity Assessment: LLE deficits/detail LLE Deficits / Details: able to wiggle toes, pt able to assist with lifting LE on/off bed LLE: Unable to fully assess due to pain    Cervical / Trunk Assessment Cervical / Trunk Assessment: Normal  Communication   Communication: No difficulties  Cognition Arousal/Alertness: Awake/alert Behavior During Therapy: WFL for tasks assessed/performed Overall Cognitive Status: Within Functional Limits for tasks assessed                                          General Comments      Exercises     Assessment/Plan    PT Assessment Patient needs continued PT services  PT Problem List Decreased strength;Decreased range of motion;Decreased activity tolerance;Decreased mobility;Decreased balance;Decreased knowledge of use of DME;Decreased safety awareness;Decreased knowledge of precautions;Pain       PT Treatment Interventions DME instruction;Patient/family education;Cognitive remediation;Neuromuscular re-education;Balance training;Therapeutic exercise;Therapeutic activities;Functional mobility training;Stair training;Gait training    PT Goals (Current goals can be found in the Care Plan section)  Acute Rehab PT Goals Patient Stated Goal: regain independence and get home PT Goal Formulation: With patient Time For Goal  Achievement: 02/01/23 Potential to Achieve Goals: Good    Frequency Min 5X/week     Co-evaluation               AM-PAC PT "6 Clicks" Mobility  Outcome Measure Help needed turning from your back to your side while in a flat bed without using bedrails?: A Little Help needed moving from lying on your back to sitting on the side of a flat bed without using bedrails?: A Little Help needed moving to and from a bed to a chair (including a wheelchair)?: A Little Help needed standing up from a chair using your arms (e.g., wheelchair or bedside chair)?: A Little Help needed to walk in hospital room?: A Lot Help needed climbing 3-5 steps with a railing? : Total 6 Click Score: 15    End of Session Equipment Utilized During Treatment: Gait belt Activity Tolerance: Patient tolerated treatment well Patient left: in bed;with call bell/phone within reach;with bed alarm set Nurse Communication: Mobility status PT Visit Diagnosis: Other abnormalities of gait and mobility (R26.89);Muscle weakness (generalized) (M62.81);Difficulty in walking, not elsewhere classified (R26.2)    Time: 1610-9604 PT Time Calculation (min) (ACUTE ONLY): 43 min   Charges:   PT Evaluation $PT Eval Low Complexity: 1 Low PT Treatments $Therapeutic Activity: 23-37 mins PT General Charges $$  ACUTE PT VISIT: 1 Visit         Wynn Maudlin, DPT Acute Rehabilitation Services Office [phone]  01/18/23 4:53 PM

## 2023-01-19 ENCOUNTER — Encounter (HOSPITAL_COMMUNITY): Payer: Self-pay | Admitting: Internal Medicine

## 2023-01-19 DIAGNOSIS — W19XXXA Unspecified fall, initial encounter: Secondary | ICD-10-CM | POA: Diagnosis not present

## 2023-01-19 DIAGNOSIS — S82142A Displaced bicondylar fracture of left tibia, initial encounter for closed fracture: Secondary | ICD-10-CM | POA: Diagnosis not present

## 2023-01-19 LAB — COMPREHENSIVE METABOLIC PANEL
ALT: 17 U/L (ref 0–44)
AST: 22 U/L (ref 15–41)
Albumin: 2.9 g/dL — ABNORMAL LOW (ref 3.5–5.0)
Alkaline Phosphatase: 57 U/L (ref 38–126)
Anion gap: 8 (ref 5–15)
BUN: <5 mg/dL — ABNORMAL LOW (ref 8–23)
CO2: 27 mmol/L (ref 22–32)
Calcium: 8.6 mg/dL — ABNORMAL LOW (ref 8.9–10.3)
Chloride: 96 mmol/L — ABNORMAL LOW (ref 98–111)
Creatinine, Ser: 0.59 mg/dL (ref 0.44–1.00)
GFR, Estimated: >60 mL/min (ref >60)
Glucose, Bld: 143 mg/dL — ABNORMAL HIGH (ref 70–99)
Potassium: 4 mmol/L (ref 3.5–5.1)
Sodium: 131 mmol/L — ABNORMAL LOW (ref 135–145)
Total Bilirubin: 0.6 mg/dL (ref 0.3–1.2)
Total Protein: 6 g/dL — ABNORMAL LOW (ref 6.5–8.1)

## 2023-01-19 LAB — CBC
HCT: 34.2 % — ABNORMAL LOW (ref 36.0–46.0)
Hemoglobin: 11.9 g/dL — ABNORMAL LOW (ref 12.0–15.0)
MCH: 31.7 pg (ref 26.0–34.0)
MCHC: 34.8 g/dL (ref 30.0–36.0)
MCV: 91.2 fL (ref 80.0–100.0)
Platelets: 253 10*3/uL (ref 150–400)
RBC: 3.75 MIL/uL — ABNORMAL LOW (ref 3.87–5.11)
RDW: 12.2 % (ref 11.5–15.5)
WBC: 11 10*3/uL — ABNORMAL HIGH (ref 4.0–10.5)
nRBC: 0 % (ref 0.0–0.2)

## 2023-01-19 LAB — VITAMIN D 25 HYDROXY (VIT D DEFICIENCY, FRACTURES): Vit D, 25-Hydroxy: 30.11 ng/mL (ref 30–100)

## 2023-01-19 MED ORDER — CALCIUM CARBONATE ANTACID 500 MG PO CHEW
1.0000 | CHEWABLE_TABLET | Freq: Three times a day (TID) | ORAL | Status: DC | PRN
  Administered 2023-01-19: 200 mg via ORAL
  Filled 2023-01-19: qty 1

## 2023-01-19 NOTE — Progress Notes (Addendum)
Orthopaedic Trauma Service Progress Note  Patient ID: Margaret Day MRN: [id number] DOB/AGE: 1956/01/10 67 y.o.  Subjective:  No issues overnight Tolerating oxy  Pain seems to be getting better   Discussed her dressing, pain management, WB restrictions and therapy expectations again  Ortho issues stable   ROS As above  Objective:   VITALS:   Vitals:   01/18/23 2057 01/19/23 0015 01/19/23 0303 01/19/23 0832  BP: (!) 123/101 (!) 150/69 (!) 141/77 129/71  Pulse: (!) 103 (!) 108 (!) 101 (!) 105  Resp: 18 18 18    Temp: 99.2 F (37.3 C) 97.6 F (36.4 C) 98.7 F (37.1 C) 98.2 F (36.8 C)  TempSrc: Oral Oral Oral Oral  SpO2: 96% 95% 97% 95%  Weight:      Height:        Estimated body mass index is 27.7 kg/m as calculated from the following:   Height as of this encounter: 5\' 2"  (1.575 m).   Weight as of this encounter: 68.7 kg.   Intake/Output      07/01 0701 07/02 0700 07/02 0701 07/03 0700   P.O. 240    I.V. (mL/kg) 1692.8 (24.6)    IV Piggyback 150    Total Intake(mL/kg) 2082.8 (30.3)    Urine (mL/kg/hr) 375 (0.2)    Blood 30    Total Output 405    Net +1677.8         Urine Occurrence 5 x 1 x   Stool Occurrence 1 x      LABS  Results for orders placed or performed during the hospital encounter of 01/15/23 (from the past 24 hour(s))  VITAMIN D 25 Hydroxy (Vit-D Deficiency, Fractures)     Status: None   Collection Time: 01/19/23  1:50 AM  Result Value Ref Range   Vit D, 25-Hydroxy 30.11 30 - 100 ng/mL  CBC     Status: Abnormal   Collection Time: 01/19/23  1:50 AM  Result Value Ref Range   WBC 11.0 (H) 4.0 - 10.5 K/uL   RBC 3.75 (L) 3.87 - 5.11 MIL/uL   Hemoglobin 11.9 (L) 12.0 - 15.0 g/dL   HCT 30.8 (L) 65.7 - 84.6 %   MCV 91.2 80.0 - 100.0 fL   MCH 31.7 26.0 - 34.0 pg   MCHC 34.8 30.0 - 36.0 g/dL   RDW 96.2 95.2 - 84.1 %   Platelets 253 150 - 400 K/uL   nRBC 0.0  0.0 - 0.2 %  Comprehensive metabolic panel     Status: Abnormal   Collection Time: 01/19/23  1:50 AM  Result Value Ref Range   Sodium 131 (L) 135 - 145 mmol/L   Potassium 4.0 3.5 - 5.1 mmol/L   Chloride 96 (L) 98 - 111 mmol/L   CO2 27 22 - 32 mmol/L   Glucose, Bld 143 (H) 70 - 99 mg/dL   BUN <5 (L) 8 - 23 mg/dL   Creatinine, Ser 3.24 0.44 - 1.00 mg/dL   Calcium 8.6 (L) 8.9 - 10.3 mg/dL   Total Protein 6.0 (L) 6.5 - 8.1 g/dL   Albumin 2.9 (L) 3.5 - 5.0 g/dL   AST 22 15 - 41 U/L   ALT 17 0 - 44 U/L   Alkaline Phosphatase 57 38 - 126 U/L   Total Bilirubin 0.6 0.3 -  1.2 mg/dL   GFR, Estimated >16 >10 mL/min   Anion gap 8 5 - 15     PHYSICAL EXAM:    Gen: sitting up in bed, looks very comfortable, NAD  Lungs: unlabored  Cardiac: reg  Ext:       Left Lower Extremity              Ace and dressing on loosely             Minimal swelling             Compartments are soft              No pain out of proportion with passive stretch              DPN, SPN, TN sensation intact             Ankle flexion, extension, inversion and eversion intact             EHL, FHL, lesser toe motor intact             + DP pulse             Good perfusion distally              ext warm   Assessment/Plan: 1 Day Post-Op   Principal Problem:   Closed bicondylar fracture of left tibial plateau Active Problems:   Pure hypercholesterolemia   SVT (supraventricular tachycardia)   Mild aortic regurgitation   Anti-infectives (From admission, onward)    Start     Dose/Rate Route Frequency Ordered Stop   01/18/23 1500  ceFAZolin (ANCEF) IVPB 1 g/50 mL premix        1 g 100 mL/hr over 30 Minutes Intravenous Every 6 hours 01/18/23 1127 01/19/23 0423   01/18/23 0830  ceFAZolin (ANCEF) 2-4 GM/100ML-% IVPB       Note to Pharmacy: [facility] L: cabinet override      01/18/23 0830 01/18/23 2044     .  POD/HD#: 1  67 y/o female s/p ground level fall with closed L bicondylar tibial plateau  fracture    -Closed L bicondylar tibial plateau fracture s/p ORIF  Weightbearing NWB L leg for 6-8 weeks                ROM/Activity                         Unrestricted ROM L Knee and ankle                                        Wound care                         Daily dressing changes starting on 01/20/2023   PT- please teach HEP for L knee ROM- AROM, PROM. Prone exercises as well. No ROM restrictions.  Quad sets, SLR, LAQ, SAQ, heel slides, stretching, prone flexion and extension   Ankle theraband program, heel cord stretching, toe towel curls, etc   No pillows under bend of knee when at rest, ok to place under heel to help work on extension. Can also use zero knee bone foam if available DO NOT LET KNEE REST IN FLEXION!!!!!    Float heels when in bed     - Pain management:  Multimodal                         Pt cannot take NSAIDs/ASA  per her report                         Pt states that tylenol causes her severe GI distress too                                                  We discussed that it would be advisable to use low dose narcotics and muscle relaxers to control her pain and to maximize her participation with therapies                                                   Ice therapy as well    - ABL anemia/Hemodynamics             Monitor---> stable   - Medical issues              Per primary    - DVT/PE prophylaxis:             Lovenox              Recommend eliquis 2.5 mg po BID x 30 days - ID:              Periop abx   - Metabolic Bone Disease:             Fragility fracture                       vitamin d levels look ok    - Impediments to fracture healing:             Poor bone quality    - Dispo:             Therapies   Likely home tomorrow after therapy    Mearl Latin, PA-C [phone] (C) 01/19/2023, 2:13 PM  [facility] [address] [place] [address] [phone] Collier Bullock (F)    After  5pm and on the weekends please log on to Amion, go to orthopaedics and the look under the [facility] Call for the provider(s) on call. You can also call our office at [phone] and then follow the prompts to be connected to the call team.  Patient ID: Margaret Day, female   DOB: 1956-06-08, 67 y.o.   MRN: [id number]

## 2023-01-19 NOTE — Evaluation (Signed)
Occupational Therapy Evaluation Patient Details Name: Margaret Day MRN: [id number] DOB: 05-09-1956 Today's Date: 01/19/2023   History of Present Illness Patient is 67 y.o. female s/p ORIF for Lt tibial plateau fracture secondary to fall. Pt also has left periorbital hematoma due to fall, CT of head negative for acute injury. PMH significant for HLD, SVT, GERD.   Clinical Impression   Pt admitted for above dx, PTA pt reports being independent in mobility and ADLs, notes this to be the only fall in the last year. Pt presenting with LLE pain and NWB status impacting gait and ability to complete bADLs. Pt needing Mod A for LBD to LLE and ambulates with min guard + RW, educated pt on tub bench to safely complete tub transfer upon DC home to which they demonstrated understanding. Pt would benefit from continued acute skilled OT services to address deficits and help transition to next level of care. No follow-up OT recommended at this time.     Recommendations for follow up therapy are one component of a multi-disciplinary discharge planning process, led by the attending physician.  Recommendations may be updated based on patient status, additional functional criteria and insurance authorization.   Assistance Recommended at Discharge Intermittent Supervision/Assistance  Patient can return home with the following A little help with walking and/or transfers;Two people to help with bathing/dressing/bathroom;Assistance with cooking/housework;Help with stairs or ramp for entrance    Functional Status Assessment  Patient has had a recent decline in their functional status and demonstrates the ability to make significant improvements in function in a reasonable and predictable amount of time.  Equipment Recommendations  Other (comment);Tub/shower bench (RW, handout provided for bench)    Recommendations for Other Services       Precautions / Restrictions Precautions Precautions:  Fall Restrictions Weight Bearing Restrictions: Yes LLE Weight Bearing: Non weight bearing      Mobility Bed Mobility Overal bed mobility: Needs Assistance Bed Mobility: Supine to Sit           General bed mobility comments: Pt providing LLE with physical assist from BUEs.    Transfers Overall transfer level: Needs assistance Equipment used: Rolling walker (2 wheels) Transfers: Sit to/from Stand Sit to Stand: Min guard                  Balance Overall balance assessment: Needs assistance Sitting-balance support: Feet supported, Bilateral upper extremity supported Sitting balance-Leahy Scale: Good     Standing balance support: Reliant on assistive device for balance, During functional activity, Bilateral upper extremity supported Standing balance-Leahy Scale: Poor Standing balance comment: RW support needed                           ADL either performed or assessed with clinical judgement   ADL Overall ADL's : Needs assistance/impaired Eating/Feeding: Independent;Sitting   Grooming: Sitting;Set up   Upper Body Bathing: Independent;Sitting   Lower Body Bathing: Sitting/lateral leans;Minimal assistance   Upper Body Dressing : Sitting;Independent   Lower Body Dressing: Sitting/lateral leans;Moderate assistance Lower Body Dressing Details (indicate cue type and reason): Mod A to don/doff L socks Toilet Transfer: Min guard;Rolling walker (2 wheels)   Toileting- Clothing Manipulation and Hygiene: Min guard;Sit to/from stand   Tub/ Shower Transfer: Min guard;Tub transfer;Tub bench Tub/Shower Transfer Details (indicate cue type and reason): Educated pt on technique for tub transfer Functional mobility during ADLs: Min guard;Rolling walker (2 wheels)       Vision  Perception     Praxis      Pertinent Vitals/Pain Pain Assessment Pain Assessment: Faces Faces Pain Scale: Hurts little more Pain Location: Lt lower leg Pain Descriptors  / Indicators: Aching, Discomfort, Guarding Pain Intervention(s): Monitored during session, Repositioned     Hand Dominance Right   Extremity/Trunk Assessment Upper Extremity Assessment Upper Extremity Assessment: Overall WFL for tasks assessed   Lower Extremity Assessment LLE Deficits / Details: able to wiggle toes, pt able to assist with lifting LE on/off bed LLE: Unable to fully assess due to pain;Unable to fully assess due to immobilization   Cervical / Trunk Assessment Cervical / Trunk Assessment: Normal   Communication Communication Communication: No difficulties   Cognition Arousal/Alertness: Awake/alert Behavior During Therapy: WFL for tasks assessed/performed Overall Cognitive Status: Within Functional Limits for tasks assessed                                       General Comments  VSS on RA, no c/o dizziness    Exercises     Shoulder Instructions      Home Living Family/patient expects to be discharged to:: Private residence Living Arrangements: Spouse/significant other;Children Available Help at Discharge: Family;Available PRN/intermittently (Sons and husbands work) Type of Home: House Home Access: Stairs to enter Secretary/administrator of Steps: 3 Entrance Stairs-Rails: Right;Left Home Layout: Two level;Bed/bath upstairs;Able to live on main level with bedroom/bathroom Alternate Level Stairs-Number of Steps: 12-18   Bathroom Shower/Tub: Walk-in shower;Tub only (walk in shower upstairs)   Bathroom Toilet: Standard Bathroom Accessibility: Yes   Home Equipment: None;Hand held shower head (shower seat from mother in law)   Additional Comments: two sons and spouse at home, daughter is coming home this summer from school in [place].      Prior Functioning/Environment Prior Level of Function : Independent/Modified Independent;Driving             Mobility Comments: ind ADLs Comments: ind        OT Problem List: Impaired balance  (sitting and/or standing)      OT Treatment/Interventions: Self-care/ADL training;Therapeutic activities;Therapeutic exercise;DME and/or AE instruction;Patient/family education;Balance training    OT Goals(Current goals can be found in the care plan section) Acute Rehab OT Goals Patient Stated Goal: To go home OT Goal Formulation: With patient Time For Goal Achievement: 02/02/23 Potential to Achieve Goals: Good ADL Goals Pt Will Perform Lower Body Bathing: sitting/lateral leans;with supervision Pt Will Perform Lower Body Dressing: with supervision;sitting/lateral leans Pt Will Transfer to Toilet: with supervision;ambulating Pt Will Perform Tub/Shower Transfer: with modified independence;ambulating;Tub transfer;tub bench  OT Frequency: Min 2X/week    Co-evaluation              AM-PAC OT "6 Clicks" Daily Activity     Outcome Measure Help from another person eating meals?: None Help from another person taking care of personal grooming?: A Little Help from another person toileting, which includes using toliet, bedpan, or urinal?: A Little Help from another person bathing (including washing, rinsing, drying)?: A Little Help from another person to put on and taking off regular upper body clothing?: A Little Help from another person to put on and taking off regular lower body clothing?: A Lot 6 Click Score: 18   End of Session Equipment Utilized During Treatment: Gait belt;Rolling walker (2 wheels) Nurse Communication: Mobility status  Activity Tolerance: Patient tolerated treatment well Patient left: Other (comment) (In rehab gym with  PT positioned at patient's side)  OT Visit Diagnosis: Unsteadiness on feet (R26.81);Other abnormalities of gait and mobility (R26.89)                Time: 1107-1140 OT Time Calculation (min): 33 min Charges:  OT General Charges $OT Visit: 1 Visit OT Evaluation $OT Eval Moderate Complexity: 1 Mod OT Treatments $Therapeutic Activity: 8-22  mins  01/19/2023  AB, OTR/L  Acute Rehabilitation Services  Office: [phone]   Tristan Schroeder 01/19/2023, 12:16 PM

## 2023-01-19 NOTE — Progress Notes (Signed)
PROGRESS NOTE   Margaret Day  ZOX:[id number]    DOB: Apr 12, 1956    DOA: 01/15/2023  PCP: Deatra James, MD     Brief Narrative:  67 year old married female with PMH of HLD, SVT, GERD, presented to the ED after sustaining a mechanical fall at a grocery store sustaining multiple injuries, most significantly to her left knee and left frontal/periorbital area.  No syncope.  Admitted for mechanical fall and left knee acute nondepressed fracture of lateral tibial plateau with a large layering knee joint lipohemarthrosis.  EDP consulted orthopedics who recommended nonoperative/conservative management and outpatient follow-up.  However patient unable to manage with crutches and was admitted for observation, pain management and further evaluation.  As per orthopedic input, patient to transfer to [facility] and had surgical intervention on 7/1.   Assessment & Plan:  Principal Problem:   Left tibial fracture Active Problems:   Pure hypercholesterolemia   SVT (supraventricular tachycardia)   Mild aortic regurgitation   Acute nondepressed fracture of left lateral tibial plateau with a large layering knee lipo hemarthrosis: - Sustained after mechanical fall PTA - CT of the left knee: Acute nondepressed fracture of the lateral tibial plateau and Large layering knee joint lipohemarthrosis. - CT head, C-spine without acute findings.  Incidental findings. - X-ray of the right shoulder and hip with left pelvis negative. - EDP discussed with Dr. Yevette Edwards, orthopedics who had recommended knee immobilizer, crutches with outpatient follow-up but patient was unable to do well with crutches or walker and was admitted. - Consulted Dr. Turner Daniels, Orthopedics.  His input much appreciated.  Indicates minimally displaced, but unstable Schatzker 4 fracture of the left tibial plateau.   - Dr. Carola Frost, ORIF on 7/1 -PT eval: home health -home in AM if pain controlled on oral agents  HLD: - Continue prior home meds/fish oil  at discharge.  SVT: - Reportedly did not tolerate metoprolol.  Follows with outpatient cardiology. -As per Dr. Shari Prows, cardiology office note from 09/04/2022: SVT well-controlled with few breakthrough episodes.  Did not tolerate metoprolol due to fatigue but has been managing well with conservative management including hydration, stress relief and exercise.  TTE with normal BiV function and no significant valve disease.  Mild to moderate AR.   Hypokalemia -replete  Hyponatremia -mild   DVT prophylaxis: enoxaparin (LOVENOX) injection 40 mg Start: 01/19/23 0800 SCDs Start: 01/18/23 1128 SCDs Start: 01/15/23 1752     Code Status: Full Code:  Family Communication: husband 7/1 Disposition:  Status is: Inpatient     Consultants:   Orthopedics    Subjective:  Pain improved with 7.5 norco  Objective:   Vitals:   01/18/23 2057 01/19/23 0015 01/19/23 0303 01/19/23 0832  BP: (!) 123/101 (!) 150/69 (!) 141/77 129/71  Pulse: (!) 103 (!) 108 (!) 101 (!) 105  Resp: 18 18 18    Temp: 99.2 F (37.3 C) 97.6 F (36.4 C) 98.7 F (37.1 C) 98.2 F (36.8 C)  TempSrc: Oral Oral Oral Oral  SpO2: 96% 95% 97% 95%  Weight:      Height:         General: Appearance:     Overweight female in no acute distress   Bruising around eye  Lungs:     respirations unlabored  Heart:    Tachycardic.  MS:   All extremities are intact.   Neurologic:   Awake, alert         Data Reviewed:   I have personally reviewed following labs and imaging studies  CBC: Recent Labs  Lab 01/15/23 1826 01/18/23 0504 01/19/23 0150  WBC 13.7* 9.2 11.0*  HGB 14.2 12.8 11.9*  HCT 41.5 37.0 34.2*  MCV 91.4 89.8 91.2  PLT 274 240 253    Basic Metabolic Panel: Recent Labs  Lab 01/15/23 1826 01/18/23 0504 01/19/23 0150  NA 133* 132* 131*  K 3.5 3.2* 4.0  CL 101 98 96*  CO2 22 23 27   GLUCOSE 145* 112* 143*  BUN 12 6* <5*  CREATININE 0.56 0.64 0.59  CALCIUM 9.2 8.7* 8.6*    Liver Function  Tests: Recent Labs  Lab 01/15/23 1826 01/19/23 0150  AST 21 22  ALT 22 17  ALKPHOS 70 57  BILITOT 0.8 0.6  PROT 7.3 6.0*  ALBUMIN 4.0 2.9*    CBG: Recent Labs  Lab 01/18/23 0702  GLUCAP 99    Microbiology Studies:   Recent Results (from the past 240 hour(s))  Surgical pcr screen     Status: None   Collection Time: 01/18/23  6:18 AM   Specimen: Nasal Mucosa; Nasal Swab  Result Value Ref Range Status   MRSA, PCR NEGATIVE NEGATIVE Final   Staphylococcus aureus NEGATIVE NEGATIVE Final    Comment: (NOTE) The Xpert SA Assay (FDA approved for NASAL specimens in patients 69 years of age and older), is one component of a comprehensive surveillance program. It is not intended to diagnose infection nor to guide or monitor treatment. Performed at [facility], [address], [place], [place] [address]     Radiology Studies:  DG Knee Left Port  Result Date: 01/18/2023 CLINICAL DATA:  Postoperative.  Fracture. EXAM: PORTABLE LEFT KNEE - 1-2 VIEW COMPARISON:  CT left knee 01/15/2023 FINDINGS: Interval lateral plate and screw fixation of the previously seen proximal tibial acute fracture extending from the lateral tibial spine through the medial cortex of the proximal tibial metaphysis. Fracture line lucency again noted. Improved alignment. No evidence of hardware failure. Severe patellofemoral joint space narrowing with moderate peripheral osteophytosis. Tiny joint effusion. IMPRESSION: Interval plate and screw fixation of the previously seen proximal tibial fracture with improved alignment. Electronically Signed   By: Neita Garnet M.D.   On: 01/18/2023 12:19   DG Knee Complete 4 Views Left  Result Date: 01/18/2023 CLINICAL DATA:  Elective surgery. ORIF left tibial plateau fracture. EXAM: LEFT KNEE - COMPLETE 4+ VIEW COMPARISON:  Left knee radiographs 01/15/2023, CT left knee 01/15/2023 FINDINGS: Images were performed intraoperatively without the presence of a radiologist.  Interval lateral plate and screw fixation of the previously seen proximal tibial fracture predominantly involving the lateral tibial spine through the medial proximal metaphysis. No hardware complication is seen. Total fluoroscopy images: 6 Total fluoroscopy time: 38 seconds Total dose: Radiation Exposure Index (as provided by the fluoroscopic device): 2.68 mGy air Kerma Please see intraoperative findings for further detail. IMPRESSION: Intraoperative images during ORIF of proximal tibial fracture. Electronically Signed   By: Neita Garnet M.D.   On: 01/18/2023 10:31   DG C-Arm 1-60 Min-No Report  Result Date: 01/18/2023 Fluoroscopy was utilized by the requesting physician.  No radiographic interpretation.    Scheduled Meds:    cholecalciferol  1,000 Units Oral Daily   docusate sodium  100 mg Oral BID   enoxaparin (LOVENOX) injection  40 mg Subcutaneous Q24H   melatonin  1.5 mg Oral QHS    Continuous Infusions:    lactated ringers 75 mL/hr at 01/18/23 2357     LOS: 3 days  Joseph Art   To contact the attending provider between 7A-7P or the covering provider during after hours 7P-7A, please log into the web site [email or web address] and access using universal [place] password for that web site. If you do not have the password, please call the hospital operator.  01/19/2023, 9:52 AM

## 2023-01-19 NOTE — Progress Notes (Signed)
Physical Therapy Treatment Patient Details Name: Margaret Day MRN: [id number] DOB: 04-11-1956 Today's Date: 01/19/2023   History of Present Illness Patient is 67 y.o. female s/p ORIF for Lt tibial plateau fracture secondary to fall. Pt also has left periorbital hematoma due to fall, CT of head negative for acute injury. PMH significant for HLD, SVT, GERD.    PT Comments  Continuing work on functional mobility and activity tolerance;  session focused on gait training, and intorduction of stair training; Overall moving quite well with RW; Pt is hesitant to do stairs with crutches, and today this PT discussed and demonstrated options for negotiating steps with a RW and min assist to steady RW, and in using a shower chair and a regular chair for getting up the 3 steps to enter her home     Assistance Recommended at Discharge Frequent or constant Supervision/Assistance  If plan is discharge home, recommend the following:  Can travel by private vehicle    A lot of help with walking and/or transfers;A little help with bathing/dressing/bathroom;Assistance with cooking/housework;Direct supervision/assist for medications management;Assist for transportation;Help with stairs or ramp for entrance      Equipment Recommendations  Rolling walker (2 wheels)    Recommendations for Other Services       Precautions / Restrictions Precautions Precautions: Fall Precaution Comments: Fall risk is present, but minimized wth RW Restrictions Weight Bearing Restrictions: Yes LLE Weight Bearing: Non weight bearing     Mobility  Bed Mobility                    Transfers Overall transfer level: Needs assistance Equipment used: Rolling walker (2 wheels) Transfers: Sit to/from Stand Sit to Stand: Min guard           General transfer comment: Good rise; needs cues for hand placement and safety; tends ot pull up on RW, but so far, RW has not tipped     Ambulation/Gait Ambulation/Gait assistance: Min guard (without physical contact) Gait Distance (Feet): 25 Feet Assistive device: Rolling walker (2 wheels) Gait Pattern/deviations: Step-to pattern       General Gait Details: Verbal and demo cues to press into RW to supoprt body weight for smoother progression and advancement of R foot; hopping can be very energetically taxing   Stairs Stairs: Yes Stairs assistance: Min guard Stair Management: No rails, Backwards, With walker Number of Stairs: 1 General stair comments: Able to get up and down one step with RW backwards, and overall steady; This PT demonstrated method for backwards with RW, and also demonstrated other ways to negotiate steps using a chair at the top and a shower seat; Pt requested to practice steps further tomorrow with her husband present   Wheelchair Mobility     Tilt Bed    Modified Rankin (Stroke Patients Only)       Balance     Sitting balance-Leahy Scale: Good       Standing balance-Leahy Scale: Poor Standing balance comment: RW support needed                            Cognition Arousal/Alertness: Awake/alert Behavior During Therapy: WFL for tasks assessed/performed Overall Cognitive Status: Within Functional Limits for tasks assessed  Exercises      General Comments General comments (skin integrity, edema, etc.): Took extra time to re-emphasize the need for the knee to be positioned in full extension at rest for optimal healing      Pertinent Vitals/Pain Pain Assessment Pain Assessment: Faces Faces Pain Scale: Hurts even more Pain Location: Lt lower leg, especially with elevation Pain Descriptors / Indicators: Aching, Discomfort, Guarding Pain Intervention(s): Monitored during session    Home Living Family/patient expects to be discharged to:: Private residence Living Arrangements: Spouse/significant  other;Children Available Help at Discharge: Family;Available PRN/intermittently (Sons and husbands work) Type of Home: House Home Access: Stairs to enter Entrance Stairs-Rails: Doctor, general practice of Steps: 3 Alternate Level Stairs-Number of Steps: 12-18 Home Layout: Two level;Bed/bath upstairs;Able to live on main level with bedroom/bathroom Home Equipment: None;Hand held shower head (shower seat from mother in law) Additional Comments: two sons and spouse at home, daughter is coming home this summer from school in [place].    Prior Function            PT Goals (current goals can now be found in the care plan section) Acute Rehab PT Goals Patient Stated Goal: regain independence and get home PT Goal Formulation: With patient Time For Goal Achievement: 02/01/23 Potential to Achieve Goals: Good Progress towards PT goals: Progressing toward goals    Frequency    Min 5X/week      PT Plan      Co-evaluation              AM-PAC PT "6 Clicks" Mobility   Outcome Measure  Help needed turning from your back to your side while in a flat bed without using bedrails?: A Little Help needed moving from lying on your back to sitting on the side of a flat bed without using bedrails?: A Little Help needed moving to and from a bed to a chair (including a wheelchair)?: A Little Help needed standing up from a chair using your arms (e.g., wheelchair or bedside chair)?: A Little Help needed to walk in hospital room?: A Little Help needed climbing 3-5 steps with a railing? : A Little 6 Click Score: 18    End of Session Equipment Utilized During Treatment: Gait belt Activity Tolerance: Patient tolerated treatment well Patient left: in chair;with call bell/phone within reach Nurse Communication: Mobility status PT Visit Diagnosis: Other abnormalities of gait and mobility (R26.89);Muscle weakness (generalized) (M62.81);Difficulty in walking, not elsewhere classified (R26.2)      Time: 1610-9604 PT Time Calculation (min) (ACUTE ONLY): 30 min  Charges:    $Gait Training: 23-37 mins PT General Charges $$ ACUTE PT VISIT: 1 Visit                     Van Clines, PT  Acute Rehabilitation Services Office [phone] Secure Chat welcomed    Levi Aland 01/19/2023, 2:50 PM

## 2023-01-19 NOTE — Progress Notes (Signed)
    Durable Medical Equipment  (From admission, onward)           Start     Ordered   01/19/23 1156  For home use only DME Walker rolling  Once       Question Answer Comment  Walker: With 5 Inch Wheels   Patient needs a walker to treat with the following condition Fx      01/19/23 1155

## 2023-01-19 NOTE — Anesthesia Postprocedure Evaluation (Signed)
Anesthesia Post Note  Patient: Margaret Day  Procedure(s) Performed: OPEN REDUCTION INTERNAL FIXATION (ORIF) TIBIAL PLATEAU (Left: Knee)     Patient location during evaluation: PACU Anesthesia Type: General Level of consciousness: sedated and patient cooperative Pain management: pain level controlled Vital Signs Assessment: post-procedure vital signs reviewed and stable Respiratory status: spontaneous breathing Cardiovascular status: stable Anesthetic complications: no  No notable events documented.  Last Vitals:  Vitals:   01/19/23 0015 01/19/23 0303  BP: (!) 150/69 (!) 141/77  Pulse: (!) 108 (!) 101  Resp: 18 18  Temp: 36.4 C 37.1 C  SpO2: 95% 97%    Last Pain:  Vitals:   01/19/23 0433  TempSrc:   PainSc: Asleep                 Lewie Loron

## 2023-01-19 NOTE — Plan of Care (Signed)

## 2023-01-20 ENCOUNTER — Other Ambulatory Visit (HOSPITAL_COMMUNITY): Payer: Self-pay

## 2023-01-20 DIAGNOSIS — S82142A Displaced bicondylar fracture of left tibia, initial encounter for closed fracture: Secondary | ICD-10-CM

## 2023-01-20 LAB — BASIC METABOLIC PANEL
Anion gap: 9 (ref 5–15)
BUN: 5 mg/dL — ABNORMAL LOW (ref 8–23)
CO2: 27 mmol/L (ref 22–32)
Calcium: 9 mg/dL (ref 8.9–10.3)
Chloride: 98 mmol/L (ref 98–111)
Creatinine, Ser: 0.52 mg/dL (ref 0.44–1.00)
GFR, Estimated: >60 mL/min (ref >60)
Glucose, Bld: 105 mg/dL — ABNORMAL HIGH (ref 70–99)
Potassium: 3.4 mmol/L — ABNORMAL LOW (ref 3.5–5.1)
Sodium: 134 mmol/L — ABNORMAL LOW (ref 135–145)

## 2023-01-20 LAB — CBC
HCT: 31.4 % — ABNORMAL LOW (ref 36.0–46.0)
Hemoglobin: 10.7 g/dL — ABNORMAL LOW (ref 12.0–15.0)
MCH: 31.3 pg (ref 26.0–34.0)
MCHC: 34.1 g/dL (ref 30.0–36.0)
MCV: 91.8 fL (ref 80.0–100.0)
Platelets: 245 10*3/uL (ref 150–400)
RBC: 3.42 MIL/uL — ABNORMAL LOW (ref 3.87–5.11)
RDW: 12.2 % (ref 11.5–15.5)
WBC: 8 10*3/uL (ref 4.0–10.5)
nRBC: 0 % (ref 0.0–0.2)

## 2023-01-20 MED ORDER — DOCUSATE SODIUM 100 MG PO CAPS
100.0000 mg | ORAL_CAPSULE | Freq: Two times a day (BID) | ORAL | 0 refills | Status: DC
  Filled 2023-01-20: qty 10, 5d supply, fill #0

## 2023-01-20 MED ORDER — APIXABAN 2.5 MG PO TABS
2.5000 mg | ORAL_TABLET | Freq: Two times a day (BID) | ORAL | 0 refills | Status: DC
  Filled 2023-01-20: qty 60, 30d supply, fill #0

## 2023-01-20 MED ORDER — POTASSIUM CHLORIDE CRYS ER 20 MEQ PO TBCR
40.0000 meq | EXTENDED_RELEASE_TABLET | Freq: Once | ORAL | Status: AC
  Administered 2023-01-20: 40 meq via ORAL
  Filled 2023-01-20: qty 2

## 2023-01-20 MED ORDER — OXYCODONE-ACETAMINOPHEN 5-325 MG PO TABS
1.0000 | ORAL_TABLET | Freq: Three times a day (TID) | ORAL | 0 refills | Status: AC | PRN
  Filled 2023-01-20: qty 40, 7d supply, fill #0

## 2023-01-20 MED ORDER — METHOCARBAMOL 500 MG PO TABS
500.0000 mg | ORAL_TABLET | Freq: Three times a day (TID) | ORAL | 0 refills | Status: DC | PRN
  Filled 2023-01-20: qty 40, 14d supply, fill #0

## 2023-01-20 NOTE — Discharge Summary (Signed)
[facility]  Physician Discharge Summary   Patient ID: Margaret Day MRN: [id number] DOB/AGE: 69-08-1955 69 y.o.  Admit date: 01/15/2023 Discharge date: 01/20/2023    PCP: Deatra James, MD  DISCHARGE DIAGNOSES:    Closed bicondylar fracture of left tibial plateau   Pure hypercholesterolemia   SVT (supraventricular tachycardia)   RECOMMENDATIONS FOR OUTPATIENT FOLLOW UP: Outpatient follow-up with orthopedics    Home Health: PT OT Equipment/Devices: None  CODE STATUS: Full code  DISCHARGE CONDITION: fair  Diet recommendation: As before  INITIAL HISTORY: 67 year old married female with PMH of HLD, SVT, GERD, presented to the ED after sustaining a mechanical fall at a grocery store sustaining multiple injuries, most significantly to her left knee and left frontal/periorbital area.  No syncope.  Admitted for mechanical fall and left knee acute nondepressed fracture of lateral tibial plateau with a large layering knee joint lipohemarthrosis.  EDP consulted orthopedics who recommended nonoperative/conservative management and outpatient follow-up.  However patient unable to manage with crutches and was admitted for observation, pain management and further evaluation.  As per orthopedic input, patient to transfer to [facility] and had surgical intervention on 7/1.    HOSPITAL COURSE:   Acute nondepressed fracture of left lateral tibial plateau with a large layering knee lipo hemarthrosis: - Sustained after mechanical fall PTA - CT of the left knee: Acute nondepressed fracture of the lateral tibial plateau and Large layering knee joint lipohemarthrosis. - CT head, C-spine without acute findings.  Incidental findings. - X-ray of the right shoulder and hip with left pelvis negative. Seen by orthopedics.  Underwent ORIF on 7/1.  Home health recommended by physical therapy.  Pain is reasonably well-controlled.   Hyperlipidemia SVT   Hypokalemia Will be supplemented prior to  discharge.  Normocytic anemia Mild drop in hemoglobin is likely dilutional.  No evidence of overt bleeding.  Hyponatremia Improved.   Patient is stable.  Okay for discharge home today.   PERTINENT LABS:  The results of significant diagnostics from this hospitalization (including imaging, microbiology, ancillary and laboratory) are listed below for reference.    Microbiology: Recent Results (from the past 240 hour(s))  Surgical pcr screen     Status: None   Collection Time: 01/18/23  6:18 AM   Specimen: Nasal Mucosa; Nasal Swab  Result Value Ref Range Status   MRSA, PCR NEGATIVE NEGATIVE Final   Staphylococcus aureus NEGATIVE NEGATIVE Final    Comment: (NOTE) The Xpert SA Assay (FDA approved for NASAL specimens in patients 2 years of age and older), is one component of a comprehensive surveillance program. It is not intended to diagnose infection nor to guide or monitor treatment. Performed at [facility], [address] [place], [place] [address]      Labs:   Basic Metabolic Panel: Recent Labs  Lab 01/15/23 1826 01/18/23 0504 01/19/23 0150 01/20/23 0211  NA 133* 132* 131* 134*  K 3.5 3.2* 4.0 3.4*  CL 101 98 96* 98  CO2 22 23 27 27   GLUCOSE 145* 112* 143* 105*  BUN 12 6* <5* 5*  CREATININE 0.56 0.64 0.59 0.52  CALCIUM 9.2 8.7* 8.6* 9.0   Liver Function Tests: Recent Labs  Lab 01/15/23 1826 01/19/23 0150  AST 21 22  ALT 22 17  ALKPHOS 70 57  BILITOT 0.8 0.6  PROT 7.3 6.0*  ALBUMIN 4.0 2.9*    CBC: Recent Labs  Lab 01/15/23 1826 01/18/23 0504 01/19/23 0150 01/20/23 0211  WBC 13.7* 9.2 11.0* 8.0  HGB 14.2  12.8 11.9* 10.7*  HCT 41.5 37.0 34.2* 31.4*  MCV 91.4 89.8 91.2 91.8  PLT 274 240 253 245    CBG: Recent Labs  Lab 01/18/23 0702  GLUCAP 99     IMAGING STUDIES DG Knee Left Port  Result Date: 01/18/2023 CLINICAL DATA:  Postoperative.  Fracture. EXAM: PORTABLE LEFT KNEE - 1-2 VIEW COMPARISON:  CT left knee 01/15/2023  FINDINGS: Interval lateral plate and screw fixation of the previously seen proximal tibial acute fracture extending from the lateral tibial spine through the medial cortex of the proximal tibial metaphysis. Fracture line lucency again noted. Improved alignment. No evidence of hardware failure. Severe patellofemoral joint space narrowing with moderate peripheral osteophytosis. Tiny joint effusion. IMPRESSION: Interval plate and screw fixation of the previously seen proximal tibial fracture with improved alignment. Electronically Signed   By: Neita Garnet M.D.   On: 01/18/2023 12:19   DG Knee Complete 4 Views Left  Result Date: 01/18/2023 CLINICAL DATA:  Elective surgery. ORIF left tibial plateau fracture. EXAM: LEFT KNEE - COMPLETE 4+ VIEW COMPARISON:  Left knee radiographs 01/15/2023, CT left knee 01/15/2023 FINDINGS: Images were performed intraoperatively without the presence of a radiologist. Interval lateral plate and screw fixation of the previously seen proximal tibial fracture predominantly involving the lateral tibial spine through the medial proximal metaphysis. No hardware complication is seen. Total fluoroscopy images: 6 Total fluoroscopy time: 38 seconds Total dose: Radiation Exposure Index (as provided by the fluoroscopic device): 2.68 mGy air Kerma Please see intraoperative findings for further detail. IMPRESSION: Intraoperative images during ORIF of proximal tibial fracture. Electronically Signed   By: Neita Garnet M.D.   On: 01/18/2023 10:31   DG C-Arm 1-60 Min-No Report  Result Date: 01/18/2023 Fluoroscopy was utilized by the requesting physician.  No radiographic interpretation.   CT Knee Left Wo Contrast  Result Date: 01/15/2023 CLINICAL DATA:  Fall, proximal tibial fracture EXAM: CT OF THE LEFT KNEE WITHOUT CONTRAST TECHNIQUE: Multidetector CT imaging of the left knee was performed according to the standard protocol. Multiplanar CT image reconstructions were also generated. RADIATION  DOSE REDUCTION: This exam was performed according to the departmental dose-optimization program which includes automated exposure control, adjustment of the mA and/or kV according to patient size and/or use of iterative reconstruction technique. COMPARISON:  X-ray 01/15/2023 FINDINGS: Bones/Joint/Cartilage Wedge-shaped cleavage fracture of the lateral tibial plateau obliquely extending to the medial cortex of the proximal tibial metaphysis (series 5, image 29). No significant articular surface depression. There is 2-3 mm of articular-surface diastasis. No fracture involvement of the medial tibial plateau. No transverse metaphyseal component. The patella, distal femur, and proximal fibula are intact without fracture. Large layering knee joint lipohemarthrosis. Ligaments Suboptimally assessed by CT. Muscles and Tendons Musculotendinous structures within normal limits by CT. Soft tissues Mild soft tissue swelling.  No hematoma. IMPRESSION: 1. Acute nondepressed fracture of the lateral tibial plateau, as described. 2. Large layering knee joint lipohemarthrosis. Electronically Signed   By: Duanne Guess D.O.   On: 01/15/2023 14:52   DG Hip Unilat W or Wo Pelvis 2-3 Views Left  Result Date: 01/15/2023 CLINICAL DATA:  Fall. EXAM: DG HIP (WITH OR WITHOUT PELVIS) 2-3V LEFT COMPARISON:  None Available. FINDINGS: There is no evidence of hip fracture or dislocation. There is no evidence of arthropathy or other focal bone abnormality. IMPRESSION: Negative. Electronically Signed   By: Sebastian Ache M.D.   On: 01/15/2023 12:27   DG Shoulder Right  Result Date: 01/15/2023 CLINICAL DATA:  Right shoulder pain  after fall. EXAM: RIGHT SHOULDER - 2+ VIEW COMPARISON:  None Available. FINDINGS: There is no evidence of fracture or dislocation. There is no evidence of arthropathy or other focal bone abnormality. Soft tissues are unremarkable. IMPRESSION: Negative. Electronically Signed   By: Lupita Raider M.D.   On: 01/15/2023  12:27   DG Knee Complete 4 Views Left  Result Date: 01/15/2023 CLINICAL DATA:  Right knee pain after fall. EXAM: LEFT KNEE - COMPLETE 4+ VIEW COMPARISON:  None Available. FINDINGS: Mildly depressed fracture is seen involving the medial portion of the tibial plateau with intra-articular extension. Small suprapatellar joint effusion is noted. Moderate narrowing of patellofemoral space is noted. IMPRESSION: Mildly depressed medial tibial plateau fracture with intra-articular extension. Electronically Signed   By: Lupita Raider M.D.   On: 01/15/2023 12:26   CT Head Wo Contrast  Result Date: 01/15/2023 CLINICAL DATA:  Provided history: Polytrauma, blunt. EXAM: CT HEAD WITHOUT CONTRAST CT CERVICAL SPINE WITHOUT CONTRAST TECHNIQUE: Multidetector CT imaging of the head and cervical spine was performed following the standard protocol without intravenous contrast. Multiplanar CT image reconstructions of the cervical spine were also generated. RADIATION DOSE REDUCTION: This exam was performed according to the departmental dose-optimization program which includes automated exposure control, adjustment of the mA and/or kV according to patient size and/or use of iterative reconstruction technique. COMPARISON:  No pertinent prior exams available for comparison. FINDINGS: CT HEAD FINDINGS Brain: Mild cerebral atrophy. CSF density posterior to the cerebellum at midline, measuring 2.2 x 4.3 x 4.4 cm, which may reflect a mega cisterna magna or posterior fossa arachnoid cyst. Partially empty sella turcica. There is no acute intracranial hemorrhage. No demarcated cortical infarct. No evidence of an intracranial mass. No midline shift. Vascular: No hyperdense vessel.  Atherosclerotic calcifications. Skull: No calvarial fracture or aggressive osseous lesion. Sinuses/Orbits: No acute finding within the orbits. No significant paranasal sinus disease at the imaged levels. Other: Forehead and left periorbital hematoma. CT CERVICAL  SPINE FINDINGS Alignment: Dextrocurvature of the cervical spine. Slight grade 1 anterolisthesis at C2-C3, C3-C4, C4-C5 and T1-T2 Skull base and vertebrae: The basion-dental and atlanto-dental intervals are maintained.No evidence of acute fracture to the cervical spine. Soft tissues and spinal canal: No prevertebral fluid or swelling. No visible canal hematoma. Disc levels: Cervical spondylosis with mild multilevel disc space narrowing and shallow multilevel disc bulges/central disc protrusions. No appreciable high-grade spinal canal stenosis. No significant bony neural foraminal narrowing. Upper chest: No consolidation within the imaged lung apices. No visible pneumothorax. IMPRESSION: CT head: 1.  No evidence of an acute intracranial abnormality. 2. Forehead and left periorbital hematoma. 3. Partially empty sella turcica. This finding can reflect incidental anatomic variation, or alternatively, it can be associated with idiopathic intracranial hypertension (pseudotumor cerebri). 4. 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 5. Mild cerebral atrophy. CT cervical spine: 1. No evidence of an acute cervical spine fracture. 2. Dextrocurvature of the cervical spine. 3. Mild grade 1 anterolisthesis at C2-C3, C3-C4, C4-C5 and T1-T2. 4. Cervical spondylosis as described. Electronically Signed   By: Jackey Loge D.O.   On: 01/15/2023 12:13   CT Cervical Spine Wo Contrast  Result Date: 01/15/2023 CLINICAL DATA:  Provided history: Polytrauma, blunt. EXAM: CT HEAD WITHOUT CONTRAST CT CERVICAL SPINE WITHOUT CONTRAST TECHNIQUE: Multidetector CT imaging of the head and cervical spine was performed following the standard protocol without intravenous contrast. Multiplanar CT image reconstructions of the cervical spine were also generated. RADIATION DOSE REDUCTION: This exam was performed  according to the departmental dose-optimization program which includes automated exposure control,  adjustment of the mA and/or kV according to patient size and/or use of iterative reconstruction technique. COMPARISON:  No pertinent prior exams available for comparison. FINDINGS: CT HEAD FINDINGS Brain: Mild cerebral atrophy. CSF density posterior to the cerebellum at midline, measuring 2.2 x 4.3 x 4.4 cm, which may reflect a mega cisterna magna or posterior fossa arachnoid cyst. Partially empty sella turcica. There is no acute intracranial hemorrhage. No demarcated cortical infarct. No evidence of an intracranial mass. No midline shift. Vascular: No hyperdense vessel.  Atherosclerotic calcifications. Skull: No calvarial fracture or aggressive osseous lesion. Sinuses/Orbits: No acute finding within the orbits. No significant paranasal sinus disease at the imaged levels. Other: Forehead and left periorbital hematoma. CT CERVICAL SPINE FINDINGS Alignment: Dextrocurvature of the cervical spine. Slight grade 1 anterolisthesis at C2-C3, C3-C4, C4-C5 and T1-T2 Skull base and vertebrae: The basion-dental and atlanto-dental intervals are maintained.No evidence of acute fracture to the cervical spine. Soft tissues and spinal canal: No prevertebral fluid or swelling. No visible canal hematoma. Disc levels: Cervical spondylosis with mild multilevel disc space narrowing and shallow multilevel disc bulges/central disc protrusions. No appreciable high-grade spinal canal stenosis. No significant bony neural foraminal narrowing. Upper chest: No consolidation within the imaged lung apices. No visible pneumothorax. IMPRESSION: CT head: 1.  No evidence of an acute intracranial abnormality. 2. Forehead and left periorbital hematoma. 3. Partially empty sella turcica. This finding can reflect incidental anatomic variation, or alternatively, it can be associated with idiopathic intracranial hypertension (pseudotumor cerebri). 4. 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 5. Mild cerebral atrophy. CT  cervical spine: 1. No evidence of an acute cervical spine fracture. 2. Dextrocurvature of the cervical spine. 3. Mild grade 1 anterolisthesis at C2-C3, C3-C4, C4-C5 and T1-T2. 4. Cervical spondylosis as described. Electronically Signed   By: Jackey Loge D.O.   On: 01/15/2023 12:13    DISCHARGE EXAMINATION: Vitals:   01/19/23 2031 01/20/23 0501 01/20/23 0842 01/20/23 1225  BP: 124/64 131/75 (!) 133/114 (!) 141/71  Pulse: (!) 107 96 (!) 105 93  Resp: 18 18 18 18   Temp: 99.2 F (37.3 C)  98 F (36.7 C) 97.6 F (36.4 C)  TempSrc: Oral     SpO2: 96% 93% 98% 95%  Weight:      Height:       General appearance: Awake alert.  In no distress Resp: Clear to auscultation bilaterally.  Normal effort Cardio: S1-S2 is normal regular.  No S3-S4.  No rubs murmurs or bruit GI: Abdomen is soft.  Nontender nondistended.  Bowel sounds are present normal.  No masses organomegaly Extremities: No edema.  Full range of motion of lower extremities.   DISPOSITION: Home  Discharge Instructions     Call MD for:  extreme fatigue   Complete by: As directed    Call MD for:  persistant dizziness or light-headedness   Complete by: As directed    Call MD for:  persistant nausea and vomiting   Complete by: As directed    Call MD for:  redness, tenderness, or signs of infection (pain, swelling, redness, odor or green/yellow discharge around incision site)   Complete by: As directed    Call MD for:  severe uncontrolled pain   Complete by: As directed    Call MD for:  temperature >100.4   Complete by: As directed    Diet general   Complete by: As directed  Increase activity slowly   Complete by: As directed           Allergies as of 01/20/2023       Reactions   Aspirin Other (See Comments)   Cannot take due to ulcer.; EGD from 2009 reviewed--normal, no ulcer   Dairy Aid [tilactase] Other (See Comments)   Headaches.   Gluten Meal Other (See Comments)   Makes her feel foggy.   Penicillins Other  (See Comments)   Facial swelling and breathing trouble. Tolerated cefazolin on 01/18/23   Sulfa Antibiotics Nausea Only   Zinc Trace Metal Additives [zinc]    "Earrings make ears bleed"   Prednisone Palpitations        Medication List     TAKE these medications    ascorbic acid 500 MG tablet Commonly known as: VITAMIN C Take 500 mg by mouth daily.   b complex vitamins tablet Take 1 tablet by mouth as needed. Reported on 09/02/2015   calcium carbonate 500 MG chewable tablet Commonly known as: TUMS - dosed in mg elemental calcium Chew 1 tablet by mouth as needed for indigestion or heartburn.   CO Q 10 PO Take 50 mg by mouth daily. Reported on 09/02/2015   docusate sodium 100 MG capsule Commonly known as: COLACE Take 1 capsule (100 mg total) by mouth 2 (two) times daily.   Eliquis 2.5 MG Tabs tablet Generic drug: apixaban Take 1 tablet (2.5 mg total) by mouth 2 (two) times daily.   Magnesium 200 MG Tabs Take 200 mg by mouth 2 (two) times daily. Per patient taking 400 mg daily   melatonin 1 MG Tabs tablet Take 1 mg by mouth at bedtime.   methocarbamol 500 MG tablet Commonly known as: ROBAXIN Take 1 tablet (500 mg total) by mouth every 8 (eight) hours as needed for muscle spasms.   NASAL SALINE NA Place into the nose daily.   OMEGA 3-6-9 FATTY ACIDS PO Take 1 capsule by mouth 2 (two) times daily.   oxyCODONE-acetaminophen 5-325 MG tablet Commonly known as: Percocet Take 1-2 tablets by mouth every 8 (eight) hours as needed for severe pain or moderate pain.   psyllium 58.6 % powder Commonly known as: METAMUCIL Take 2 packets by mouth daily.   Vitamin D 1000 units capsule Take 1,000 Units by mouth daily. Reported on 09/02/2015          Follow-up Information     Deatra James, MD Follow up.   Specialty: Family Medicine Contact information: [phone] [place] [address] [phone]         [facility] Follow up.    Specialty: Home Health Services Why: Home health PT services will be provided by [facility], start of care with 48 hours post discharge Contact information: [address] [place] [address] [phone]         Myrene Galas, MD. Schedule an appointment as soon as possible for a visit on 02/03/2023.   Specialty: Orthopedic Surgery Contact information: [address] [place] [address] [phone]                 TOTAL DISCHARGE TIME: 35 minutes  Manraj Yeo Rito Ehrlich  [facility] Pager on [email or web address]  01/21/2023, 12:04 PM

## 2023-01-20 NOTE — Progress Notes (Signed)
Physical Therapy Treatment Patient Details Name: Margaret Day MRN: [id number] DOB: 26-Nov-1955 Today's Date: 01/20/2023   History of Present Illness Patient is 67 y.o. female s/p ORIF for Lt tibial plateau fracture secondary to fall. Pt also has left periorbital hematoma due to fall, CT of head negative for acute injury. PMH significant for HLD, SVT, GERD.    PT Comments  Continuing work on functional mobility and activity tolerance;  session focused on progressive amb and stair trianing; Husband present and supportive/engaged; Overall moving well and keeping NWB with amb with RW; discussed car transfers; Questions solicited and answered; Provided pt with [employer] HEP; OK for dc home from PT standpoint    Assistance Recommended at Discharge Frequent or constant Supervision/Assistance  If plan is discharge home, recommend the following:  Can travel by private vehicle    A lot of help with walking and/or transfers;A little help with bathing/dressing/bathroom;Assistance with cooking/housework;Direct supervision/assist for medications management;Assist for transportation;Help with stairs or ramp for entrance      Equipment Recommendations  Rolling walker (2 wheels)    Recommendations for Other Services       Precautions / Restrictions Precautions Precautions: Fall Precaution Comments: Fall risk is present, but minimized wth RW Restrictions Weight Bearing Restrictions: Yes LLE Weight Bearing: Non weight bearing     Mobility  Bed Mobility Overal bed mobility: Needs Assistance Bed Mobility: Supine to Sit     Supine to sit: Supervision     General bed mobility comments: smooth motion to get up to EOB    Transfers Overall transfer level: Needs assistance Equipment used: Rolling walker (2 wheels) Transfers: Sit to/from Stand Sit to Stand: Min guard           General transfer comment: cues for hand placment    Ambulation/Gait Ambulation/Gait assistance:  Supervision Gait Distance (Feet): 60 Feet Assistive device: Rolling walker (2 wheels) Gait Pattern/deviations: Step-to pattern       General Gait Details: Smooth RLE advancement   Stairs Stairs: Yes Stairs assistance: Min guard Stair Management: No rails, Backwards, With walker Number of Stairs: 2 General stair comments: Backed up 1 step, and then "sat down up the step" for the last step; husband providing good assist   Wheelchair Mobility     Tilt Bed    Modified Rankin (Stroke Patients Only)       Balance     Sitting balance-Leahy Scale: Good       Standing balance-Leahy Scale: Poor                              Cognition Arousal/Alertness: Awake/alert Behavior During Therapy: WFL for tasks assessed/performed Overall Cognitive Status: Within Functional Limits for tasks assessed                                          Exercises      General Comments General comments (skin integrity, edema, etc.): Provided husband with education and demonstration of stair options; answered pt's questions about positioning and therex      Pertinent Vitals/Pain Pain Assessment Pain Assessment: Faces Faces Pain Scale: Hurts little more Pain Location: Lt lower leg Pain Descriptors / Indicators: Aching, Discomfort, Guarding Pain Intervention(s): Monitored during session    Home Living  Prior Function            PT Goals (current goals can now be found in the care plan section) Acute Rehab PT Goals Patient Stated Goal: regain independence and get home PT Goal Formulation: With patient Time For Goal Achievement: 02/01/23 Potential to Achieve Goals: Good Progress towards PT goals: Progressing toward goals    Frequency    Min 5X/week      PT Plan Current plan remains appropriate    Co-evaluation              AM-PAC PT "6 Clicks" Mobility   Outcome Measure  Help needed turning from your  back to your side while in a flat bed without using bedrails?: A Little Help needed moving from lying on your back to sitting on the side of a flat bed without using bedrails?: A Little Help needed moving to and from a bed to a chair (including a wheelchair)?: A Little Help needed standing up from a chair using your arms (e.g., wheelchair or bedside chair)?: A Little Help needed to walk in hospital room?: A Little Help needed climbing 3-5 steps with a railing? : A Little 6 Click Score: 18    End of Session Equipment Utilized During Treatment: Gait belt Activity Tolerance: Patient tolerated treatment well Patient left: in chair;with call bell/phone within reach Nurse Communication: Mobility status PT Visit Diagnosis: Other abnormalities of gait and mobility (R26.89);Muscle weakness (generalized) (M62.81);Difficulty in walking, not elsewhere classified (R26.2)     Time: 1610-9604 PT Time Calculation (min) (ACUTE ONLY): 52 min  Charges:    $Gait Training: 23-37 mins $Therapeutic Activity: 8-22 mins PT General Charges $$ ACUTE PT VISIT: 1 Visit                     Van Clines, PT  Acute Rehabilitation Services Office [phone] Secure Chat welcomed    Margaret Day 01/20/2023, 1:53 PM

## 2023-01-20 NOTE — Progress Notes (Signed)
Orthopaedic Trauma Service Progress Note  Patient ID: Margaret Day MRN: [id number] DOB/AGE: [birth year] 67 y.o.  Subjective:  Feels better this am  No specific complaints  Would like to go home today   ROS As above  Objective:   VITALS:   Vitals:   01/19/23 1457 01/19/23 2031 01/20/23 0501 01/20/23 0842  BP: (!) 157/59 124/64 131/75 (!) 133/114  Pulse: (!) 102 (!) 107 96 (!) 105  Resp:  18 18 18   Temp: 98.2 F (36.8 C) 99.2 F (37.3 C)  98 F (36.7 C)  TempSrc: Oral Oral    SpO2: 93% 96% 93% 98%  Weight:      Height:        Estimated body mass index is 27.7 kg/m as calculated from the following:   Height as of this encounter: 5\' 2"  (1.575 m).   Weight as of this encounter: 68.7 kg.   Intake/Output      07/02 0701 07/03 0700 07/03 0701 07/04 0700   P.O. 240    I.V. (mL/kg) 2245.5 (32.7)    IV Piggyback     Total Intake(mL/kg) 2485.5 (36.2)    Urine (mL/kg/hr)     Blood     Total Output     Net +2485.5         Urine Occurrence 6 x    Stool Occurrence 2 x      LABS  Results for orders placed or performed during the hospital encounter of 01/15/23 (from the past 24 hour(s))  CBC     Status: Abnormal   Collection Time: 01/20/23  2:11 AM  Result Value Ref Range   WBC 8.0 4.0 - 10.5 K/uL   RBC 3.42 (L) 3.87 - 5.11 MIL/uL   Hemoglobin 10.7 (L) 12.0 - 15.0 g/dL   HCT 40.9 (L) 81.1 - 91.4 %   MCV 91.8 80.0 - 100.0 fL   MCH 31.3 26.0 - 34.0 pg   MCHC 34.1 30.0 - 36.0 g/dL   RDW 78.2 95.6 - 21.3 %   Platelets 245 150 - 400 K/uL   nRBC 0.0 0.0 - 0.2 %  Basic metabolic panel     Status: Abnormal   Collection Time: 01/20/23  2:11 AM  Result Value Ref Range   Sodium 134 (L) 135 - 145 mmol/L   Potassium 3.4 (L) 3.5 - 5.1 mmol/L   Chloride 98 98 - 111 mmol/L   CO2 27 22 - 32 mmol/L   Glucose, Bld 105 (H) 70 - 99 mg/dL   BUN 5 (L) 8 - 23 mg/dL   Creatinine, Ser 0.86 0.44 -  1.00 mg/dL   Calcium 9.0 8.9 - 57.8 mg/dL   GFR, Estimated >46 >96 mL/min   Anion gap 9 5 - 15     PHYSICAL EXAM:   Gen: sitting up in bed, looks very comfortable, NAD  Lungs: unlabored  Cardiac: reg  Ext:       Left Lower Extremity              dressings removed   Incisions look great   No signs of infection    No drainage              Minimal swelling             Compartments are soft  No pain out of proportion with passive stretch              DPN, SPN, TN sensation intact             Ankle flexion, extension, inversion and eversion intact             EHL, FHL, lesser toe motor intact             + DP pulse             Good perfusion distally              ext warm   Assessment/Plan: 2 Days Post-Op   Principal Problem:   Closed bicondylar fracture of left tibial plateau Active Problems:   Pure hypercholesterolemia   SVT (supraventricular tachycardia)   Mild aortic regurgitation   Anti-infectives (From admission, onward)    Start     Dose/Rate Route Frequency Ordered Stop   01/18/23 1500  ceFAZolin (ANCEF) IVPB 1 g/50 mL premix        1 g 100 mL/hr over 30 Minutes Intravenous Every 6 hours 01/18/23 1127 01/19/23 0423   01/18/23 0830  ceFAZolin (ANCEF) 2-4 GM/100ML-% IVPB       Note to Pharmacy: Susy [facility] L: cabinet override      01/18/23 0830 01/18/23 2044     .  POD/HD#: 2  67 y/o female s/p ground level fall with closed L bicondylar tibial plateau fracture    -Closed L bicondylar tibial plateau fracture s/p ORIF  Weightbearing NWB L leg for 6-8 weeks                ROM/Activity                         Unrestricted ROM L Knee and ankle                                        Wound care                         Daily dressing changed today     New Mepilex dressings applied    Can change again in 2 to 3 days    Okay to shower and clean with soap and water at this point.  Apply new dressings after shower    Okay to leave open to the  air once there is no drainage as well   PT- please teach HEP for L knee ROM- AROM, PROM. Prone exercises as well. No ROM restrictions.  Quad sets, SLR, LAQ, SAQ, heel slides, stretching, prone flexion and extension   Ankle theraband program, heel cord stretching, toe towel curls, etc   No pillows under bend of knee when at rest, ok to place under heel to help work on extension. Can also use zero knee bone foam if available DO NOT LET KNEE REST IN FLEXION!!!!!                 Float heels when in bed     - Pain management:             Multimodal                         Pt cannot take NSAIDs/ASA  per her report  Pt states that tylenol causes her severe GI distress too                                                  dc home with percocet and robaxin                                                   Ice therapy as well    - ABL anemia/Hemodynamics             Monitor---> stable   - Medical issues              Per primary    - DVT/PE prophylaxis:             Lovenox              Recommend eliquis 2.5 mg po BID x 30 days - ID:              Periop abx completed   - Metabolic Bone Disease:             Fragility fracture                       vitamin d levels look ok    - Impediments to fracture healing:             Poor bone quality    - Dispo:             Therapies              Anticipate discharge home today.  Follow-up with orthopedics in 2 weeks   Mearl Latin, PA-C [phone] (C) 01/20/2023, 9:23 AM  [facility] [address] [place] [address] [phone] [phone] (F)    After 5pm and on the weekends please log on to Amion, go to orthopaedics and the look under the [facility] Call for the provider(s) on call. You can also call our office at [phone] and then follow the prompts to be connected to the call team.  Patient ID: Margaret Day, female   DOB: Sep 05, 1955, 67 y.o.   MRN:  [id number]

## 2023-01-20 NOTE — Discharge Instructions (Signed)
Orthopaedic Trauma Service Discharge Instructions   General Discharge Instructions  Orthopaedic Injuries:  Left tibial plateau fracture treated with open reduction and internal fixation using plate and screws  WEIGHT BEARING STATUS: Nonweightbearing left leg for the next 6 to 8 weeks.  We will discuss pool therapy at your first follow-up visit.  Use walker to help mobilize.  RANGE OF MOTION/ACTIVITY: Unrestricted range of motion of left knee.  Continue to do exercises that were taught to you by therapy.  Do them 2-3 times a day.  Ice after doing these exercises.  Activity as tolerated while maintaining weightbearing restrictions  Do not place pillows under the back of the knee.  Place him under your ankle to keep your knee in full extension (fully straight) when not working on range of motion.  When not working on exercises knee is to be all the way straight or all the way bent.  Do not let knee rest in a partially bent position  Bone health: Continue with your home vitamin D supplementation.  We would recommend getting a bone density scan in the next 4 to 8 weeks  Review the following resource for additional information regarding bone health  [email or web address]  Wound Care: Daily wound care as needed starting on 01/22/2023.  Once there is no drainage he can leave open to the air.  You may shower and clean your incisions with soap and water once there is no drainage.  Would also recommend using compression sock to help with swelling control.  DVT/PE prophylaxis: Eliquis 2.5 mg by mouth every 12 hours for the next 30 days for blood clot prevention  Diet: as you were eating previously.  Can use over the counter stool softeners and bowel preparations, such as Miralax, to help with bowel movements.  Narcotics can be constipating.  Be sure to drink plenty of fluids  PAIN MEDICATION USE AND EXPECTATIONS  You have likely been given narcotic medications to help control  your pain.  After a traumatic event that results in an fracture (broken bone) with or without surgery, it is ok to use narcotic pain medications to help control one's pain.  We understand that everyone responds to pain differently and each individual patient will be evaluated on a regular basis for the continued need for narcotic medications. Ideally, narcotic medication use should last no more than 6-8 weeks (coinciding with fracture healing).   As a patient it is your responsibility as well to monitor narcotic medication use and report the amount and frequency you use these medications when you come to your office visit.   We would also advise that if you are using narcotic medications, you should take a dose prior to therapy to maximize you participation.  IF YOU ARE ON NARCOTIC MEDICATIONS IT IS NOT PERMISSIBLE TO OPERATE A MOTOR VEHICLE (MOTORCYCLE/CAR/TRUCK/MOPED) OR HEAVY MACHINERY DO NOT MIX NARCOTICS WITH OTHER CNS (CENTRAL NERVOUS SYSTEM) DEPRESSANTS SUCH AS ALCOHOL   POST-OPERATIVE OPIOID TAPER INSTRUCTIONS: It is important to wean off of your opioid medication as soon as possible. If you do not need pain medication after your surgery it is ok to stop day one. Opioids include: Codeine, Hydrocodone(Norco, Vicodin), Oxycodone(Percocet, oxycontin) and hydromorphone amongst others.  Long term and even short term use of opiods can cause: Increased pain response Dependence Constipation Depression Respiratory depression And more.  Withdrawal symptoms can include Flu like symptoms Nausea, vomiting And more Techniques to manage these symptoms Hydrate well Eat regular healthy meals Stay active Use relaxation techniques(deep  breathing, meditating, yoga) Do Not substitute Alcohol to help with tapering If you have been on opioids for less than two weeks and do not have pain than it is ok to stop all together.  Plan to wean off of opioids This plan should start within one week post op of  your fracture surgery  Maintain the same interval or time between taking each dose and first decrease the dose.  Cut the total daily intake of opioids by one tablet each day Next start to increase the time between doses. The last dose that should be eliminated is the evening dose.    STOP SMOKING OR USING NICOTINE PRODUCTS!!!!  As discussed nicotine severely impairs your body's ability to heal surgical and traumatic wounds but also impairs bone healing.  Wounds and bone heal by forming microscopic blood vessels (angiogenesis) and nicotine is a vasoconstrictor (essentially, shrinks blood vessels).  Therefore, if vasoconstriction occurs to these microscopic blood vessels they essentially disappear and are unable to deliver necessary nutrients to the healing tissue.  This is one modifiable factor that you can do to dramatically increase your chances of healing your injury.    (This means no smoking, no nicotine gum, patches, etc)  DO NOT USE NONSTEROIDAL ANTI-INFLAMMATORY DRUGS (NSAID'S)  Using products such as Advil (ibuprofen), Aleve (naproxen), Motrin (ibuprofen) for additional pain control during fracture healing can delay and/or prevent the healing response.  If you would like to take over the counter (OTC) medication, Tylenol (acetaminophen) is ok.  However, some narcotic medications that are given for pain control contain acetaminophen as well. Therefore, you should not exceed more than 4000 mg of tylenol in a day if you do not have liver disease.  Also note that there are may OTC medicines, such as cold medicines and allergy medicines that my contain tylenol as well.  If you have any questions about medications and/or interactions please ask your doctor/PA or your pharmacist.      ICE AND ELEVATE INJURED/OPERATIVE EXTREMITY  Using ice and elevating the injured extremity above your heart can help with swelling and pain control.  Icing in a pulsatile fashion, such as 20 minutes on and 20 minutes  off, can be followed.    Do not place ice directly on skin. Make sure there is a barrier between to skin and the ice pack.    Using frozen items such as frozen peas works well as the conform nicely to the are that needs to be iced.  USE AN ACE WRAP OR TED HOSE FOR SWELLING CONTROL  In addition to icing and elevation, Ace wraps or TED hose are used to help limit and resolve swelling.  It is recommended to use Ace wraps or TED hose until you are informed to stop.    When using Ace Wraps start the wrapping distally (farthest away from the body) and wrap proximally (closer to the body)   Example: If you had surgery on your leg or thing and you do not have a splint on, start the ace wrap at the toes and work your way up to the thigh        If you had surgery on your upper extremity and do not have a splint on, start the ace wrap at your fingers and work your way up to the upper arm  IF YOU ARE IN A SPLINT OR CAST DO NOT REMOVE IT FOR ANY REASON   If your splint gets wet for any reason please contact the office  immediately. You may shower in your splint or cast as long as you keep it dry.  This can be done by wrapping in a cast cover or garbage back (or similar)  Do Not stick any thing down your splint or cast such as pencils, money, or hangers to try and scratch yourself with.  If you feel itchy take benadryl as prescribed on the bottle for itching  IF YOU ARE IN A CAM BOOT (BLACK BOOT)  You may remove boot periodically. Perform daily dressing changes as noted below.  Wash the liner of the boot regularly and wear a sock when wearing the boot. It is recommended that you sleep in the boot until told otherwise    Call office for the following: Temperature greater than 101F Persistent nausea and vomiting Severe uncontrolled pain Redness, tenderness, or signs of infection (pain, swelling, redness, odor or green/yellow discharge around the site) Difficulty breathing, headache or visual  disturbances Hives Persistent dizziness or light-headedness Extreme fatigue Any other questions or concerns you may have after discharge  In an emergency, call 911 or go to an Emergency Department at a nearby hospital  HELPFUL INFORMATION  If you had a block, it will wear off between 8-24 hrs postop typically.  This is period when your pain may go from nearly zero to the pain you would have had postop without the block.  This is an abrupt transition but nothing dangerous is happening.  You may take an extra dose of narcotic when this happens.  You should wean off your narcotic medicines as soon as you are able.  Most patients will be off or using minimal narcotics before their first postop appointment.   We suggest you use the pain medication the first night prior to going to bed, in order to ease any pain when the anesthesia wears off. You should avoid taking pain medications on an empty stomach as it will make you nauseous.  Do not drink alcoholic beverages or take illicit drugs when taking pain medications.  In most states it is against the law to drive while you are in a splint or sling.  And certainly against the law to drive while taking narcotics.  You may return to work/school in the next couple of days when you feel up to it.   Pain medication may make you constipated.  Below are a few solutions to try in this order: Decrease the amount of pain medication if you aren't having pain. Drink lots of decaffeinated fluids. Drink prune juice and/or each dried prunes  If the first 3 don't work start with additional solutions Take Colace - an over-the-counter stool softener Take Senokot - an over-the-counter laxative Take Miralax - a stronger over-the-counter laxative     CALL THE OFFICE WITH ANY QUESTIONS OR CONCERNS: [phone]   VISIT OUR WEBSITE FOR ADDITIONAL INFORMATION: [email or web address]

## 2023-01-20 NOTE — Progress Notes (Signed)
Explained discharge instructions to patient. Reviewed follow up appointment and next medication administration times. Also reviewed education. Patient verbalized having an understanding for instructions given. All belongings are in the patient's possession to include TOC meds. IV removed. No other needs verbalized. Transported downstairs for discharge.

## 2023-01-20 NOTE — Plan of Care (Signed)
  Problem: Education: Goal: Knowledge of General Education information will improve Description: Including pain rating scale, medication(s)/side effects and non-pharmacologic comfort measures Outcome: Adequate for Discharge   Problem: Health Behavior/Discharge Planning: Goal: Ability to manage health-related needs will improve Outcome: Adequate for Discharge   Problem: Clinical Measurements: Goal: Ability to maintain clinical measurements within normal limits will improve Outcome: Adequate for Discharge Goal: Will remain free from infection Outcome: Adequate for Discharge Goal: Diagnostic test results will improve Outcome: Adequate for Discharge Goal: Respiratory complications will improve Outcome: Adequate for Discharge Goal: Cardiovascular complication will be avoided Outcome: Adequate for Discharge   Problem: Activity: Goal: Risk for activity intolerance will decrease Outcome: Adequate for Discharge   Problem: Nutrition: Goal: Adequate nutrition will be maintained Outcome: Adequate for Discharge   Problem: Coping: Goal: Level of anxiety will decrease Outcome: Adequate for Discharge   Problem: Elimination: Goal: Will not experience complications related to bowel motility Outcome: Adequate for Discharge Goal: Will not experience complications related to urinary retention Outcome: Adequate for Discharge   Problem: Pain Managment: Goal: General experience of comfort will improve Outcome: Adequate for Discharge   Problem: Safety: Goal: Ability to remain free from injury will improve Outcome: Adequate for Discharge   Problem: Skin Integrity: Goal: Risk for impaired skin integrity will decrease Outcome: Adequate for Discharge   Problem: Education: Goal: Knowledge of General Education information will improve Description: Including pain rating scale, medication(s)/side effects and non-pharmacologic comfort measures Outcome: Adequate for Discharge   Problem: Health  Behavior/Discharge Planning: Goal: Ability to manage health-related needs will improve Outcome: Adequate for Discharge   Problem: Clinical Measurements: Goal: Ability to maintain clinical measurements within normal limits will improve Outcome: Adequate for Discharge Goal: Will remain free from infection Outcome: Adequate for Discharge Goal: Diagnostic test results will improve Outcome: Adequate for Discharge Goal: Respiratory complications will improve Outcome: Adequate for Discharge Goal: Cardiovascular complication will be avoided Outcome: Adequate for Discharge   Problem: Activity: Goal: Risk for activity intolerance will decrease Outcome: Adequate for Discharge   Problem: Nutrition: Goal: Adequate nutrition will be maintained Outcome: Adequate for Discharge   Problem: Coping: Goal: Level of anxiety will decrease Outcome: Adequate for Discharge   Problem: Elimination: Goal: Will not experience complications related to bowel motility Outcome: Adequate for Discharge Goal: Will not experience complications related to urinary retention Outcome: Adequate for Discharge   Problem: Pain Managment: Goal: General experience of comfort will improve Outcome: Adequate for Discharge   Problem: Safety: Goal: Ability to remain free from injury will improve Outcome: Adequate for Discharge   Problem: Skin Integrity: Goal: Risk for impaired skin integrity will decrease Outcome: Adequate for Discharge   Problem: Acute Rehab PT Goals(only PT should resolve) Goal: Pt Will Go Supine/Side To Sit Outcome: Adequate for Discharge Goal: Patient Will Transfer Sit To/From Stand Outcome: Adequate for Discharge Goal: Pt Will Ambulate Outcome: Adequate for Discharge Goal: Pt Will Go Up/Down Stairs Outcome: Adequate for Discharge   Problem: Acute Rehab OT Goals (only OT should resolve) Goal: Pt. Will Perform Lower Body Bathing Outcome: Adequate for Discharge Goal: Pt. Will Perform Lower  Body Dressing Outcome: Adequate for Discharge Goal: Pt. Will Transfer To Toilet Outcome: Adequate for Discharge Goal: Pt. Will Perform Tub/Shower Transfer Outcome: Adequate for Discharge

## 2023-01-22 ENCOUNTER — Encounter (HOSPITAL_COMMUNITY): Payer: Self-pay | Admitting: Orthopedic Surgery

## 2023-02-01 ENCOUNTER — Other Ambulatory Visit (HOSPITAL_COMMUNITY): Payer: 59

## 2023-02-28 NOTE — Op Note (Signed)
02/28/2023 9:37 PM  PATIENT:  Margaret Day  67 y.o. female [id number]  PRE-OPERATIVE DIAGNOSIS:   1. LEFT BICONDYLAR TIBIAL PLATEAU FRACTURE 2. LEFT KNEE HEMARTHROSIS  POST-OPERATIVE DIAGNOSIS:   1. LEFT BICONDYLAR TIBIAL PLATEAU FRACTURE 2. LEFT KNEE HEMARTHROSIS  PROCEDURE:  Procedure(s): 1. OPEN REDUCTION INTERNAL FIXATION (ORIF) BICONDYLAR TIBIAL PLATEAU (Left) 2. ANTERIOR COMPARTMENT FASCIOTOMY 3. ASPIRATION OF LEFT KNEE HEMARTHROSIS 4. APPLICATION OF STRESS TO THE KNEE UNDER FLUOROSCOPY  SURGEON:  Surgeon(s) and Role:    Myrene Galas, MD - Primary  PHYSICIAN ASSISTANT: Montez Morita, PA-C  ANESTHESIA:   general  EBL:  30 mL   BLOOD ADMINISTERED:none  DRAINS: none   LOCAL MEDICATIONS USED:  NONE  SPECIMEN: None  DISPOSITION OF SPECIMEN:  N/A  COUNTS:  YES  TOURNIQUET:  * No tourniquets in log *  DICTATION: Written below.  PLAN OF CARE: Admit to inpatient   PATIENT DISPOSITION:  PACU - hemodynamically stable.   Delay start of Pharmacological VTE agent (>24hrs) due to surgical blood loss or risk of bleeding: no     BRIEF SUMMARY AND INDICATION FOR PROCEDURE:  Patient is a 67 y.o.-year- old with a tibial plateau fracture, treated provisionally with immediate immobilization, elevation, and active motion of the foot and toes to facilitate resolution of soft tissue swelling.  We did discuss with the patient the risks and benefits of surgical treatment including the potential for arthritis, nerve injury, vessel injury, loss of motion, DVT, PE, heart attack, stroke, symptomatic hardware, need for further surgery, and multiple others.  The patient acknowledged these risks and wished to proceed.   BRIEF SUMMARY OF PROCEDURE:  After administration of preoperative antibiotics, the patient was taken to the operating room.  General anesthesia was induced and the lower extremity prepped and draped in usual sterile fashion using a chlorhexidine wash and  betadine scrub and paint. A timeout was performed. I then brought in the radiolucent triangle. We began with a lateral curvilinear incision was made extending laterally over Gerdy's tubercle. Dissection was carried down where the soft tissues were left intact to the lateral plateau and rim, elevated only the insertion of the extensors. I was then able to insert the 9 hole Zimmer NCB tibial plateau plate and pin it proximally at the joint line. My assistant pulled traction and derotated the fracture while I used the [employer] clamp to achieve reduction. I then placed two lag screws through the plate at the subchondral level to compress the articular surface. I followed with three bicortical screws distally securing the buttress effect. At this point, I placed additional standard lag screw fixation in the proximal row of the plate, then converted all proximal screws to locked fixation by placing locking caps. Final images showed appropriate reduction, implant position and length. I did apply manual stress under fluoroscopy to the knee which showed intact, stable collateral ligaments. All wounds were irrigated thoroughly.   Prior to closure, I turned my attention to the distal edge of the wound here underneath the skin.  I used the long scissors to spread both superficial and deep to the anterior compartment.  The fascia was then released for 8 to 10 cm to reduce the likelihood of the postoperative compartment syndrome.  Once more, wound was irrigated and then a standard layered closure performed, 0 Vicryl, 2-0 Vicryl, and 3-0 nylon for the skin.  Sterile gently compressive dressing was applied and in knee immobilizer.  The patient was taken to the PACU in stable condition.  Lastly, the large knee hemarthrosis was aspirated removing 60 cc of blood.  Montez Morita, PA-C was present and assisting throughout with all portions of the procedure.   PROGNOSIS: The patient will not require formal bracing given  the stable examination post fixation. Unrestricted range of motion will begin immediately.  He will be nonweightbearing on the operative extremity, be on pharmacologic DVT prophylaxis, and mobilized with PT and OT. After discharge, we will plan to see him back in about 2 weeks for removal of sutures. Discharge anticipated in two days.         Doralee Albino. Carola Frost, M.D.

## 2023-09-28 ENCOUNTER — Ambulatory Visit: Admission: RE | Admit: 2023-09-28 | Payer: 59 | Source: Ambulatory Visit

## 2023-09-28 ENCOUNTER — Other Ambulatory Visit: Payer: Self-pay | Admitting: Family Medicine

## 2023-09-28 DIAGNOSIS — Z1231 Encounter for screening mammogram for malignant neoplasm of breast: Secondary | ICD-10-CM

## 2023-10-19 ENCOUNTER — Ambulatory Visit
Admission: RE | Admit: 2023-10-19 | Discharge: 2023-10-19 | Disposition: A | Discharge status: Home / Self Care | Source: Ambulatory Visit | Attending: Family Medicine | Admitting: Family Medicine

## 2023-10-19 DIAGNOSIS — Z1231 Encounter for screening mammogram for malignant neoplasm of breast: Secondary | ICD-10-CM

## 2024-06-27 ENCOUNTER — Ambulatory Visit (HOSPITAL_COMMUNITY)
Admission: RE | Admit: 2024-06-27 | Discharge: 2024-06-27 | Disposition: A | Source: Ambulatory Visit | Attending: Cardiology | Admitting: Cardiology

## 2024-06-27 ENCOUNTER — Other Ambulatory Visit (HOSPITAL_COMMUNITY): Payer: Self-pay | Admitting: Internal Medicine

## 2024-06-27 DIAGNOSIS — I351 Nonrheumatic aortic (valve) insufficiency: Secondary | ICD-10-CM | POA: Diagnosis not present

## 2024-06-27 DIAGNOSIS — I359 Nonrheumatic aortic valve disorder, unspecified: Secondary | ICD-10-CM

## 2024-06-27 LAB — ECHOCARDIOGRAM COMPLETE
Area-P 1/2: 3.27 cm2
P 1/2 time: 397 ms
S' Lateral: 3 cm

## 2024-06-29 ENCOUNTER — Ambulatory Visit: Payer: Self-pay | Admitting: Internal Medicine

## 2024-07-24 NOTE — Progress Notes (Signed)
 "   Cardiology Office Note   Date:  07/25/2024   ID:  FARRYN LINARES, DOB 04/12/1956, MRN [id number]  PCP:  Sun, Vyvyan, MD  Cardiologist:   Vina Gull, MD   Patient presents for follow up of palpitations and HL    History of Present Illness: Margaret Day is a 69 y.o. female with a history of HL and PSVT  PT previously followed by VEAR Sorrow  Last seen in 2024  -June 2023  Seen in [facility] for dizziness and heart racing  Per patinet lasted awhile  In EMS had intermitt SVT   In ER was SR   -July 2023  Echo  LVEF 60 to 65%  Normal RVEF   Mild to moderate AI  Recomm follow up in 2 years -July 2023  Monitor showed 5 brief runs of SVT  Longest lasted 9.9 seconds   Triggered events correlated with SR, SR with PAC, SR with PVC  PACS and PVCs were rare  Pt tried on metoprolol   DId not tolerate due to fatigue    Dec 2025  The pt hd an echo done   This showed Normal LVEF and RVEF  AI appeared moderate on my review     The pt notes some heart fluttering  Mostly with mental stress   Has not had any long spells Denies CP  Breathing is overall OK  She denies dizziness   No syncope  Admits to needed to do more physical activity   Used to swim      Active Medications[1]   Allergies:   Aspirin, Dairy aid [tilactase], Gluten meal, Penicillins, Sulfa antibiotics, Zinc trace metal additives [zinc], and Prednisone   Past Medical History:  Diagnosis Date   Allergy    seasonal   Closed bicondylar fracture of left tibial plateau 01/15/2023   Hyperlipidemia    Palpitations    h/o, probably benign   Reflux    normal EGD 2009   SVT (supraventricular tachycardia)     Past Surgical History:  Procedure Laterality Date   CESAREAN SECTION  1992   COLONOSCOPY  06/2005   Dr. Nolon; 11/2014 Dr. Kristie   ESOPHAGOGASTRODUODENOSCOPY  2009   unremarkable   ORIF TIBIA PLATEAU Left 01/18/2023   Procedure: OPEN REDUCTION INTERNAL FIXATION (ORIF) TIBIAL PLATEAU;  Surgeon: Celena Sharper, MD;   Location: [facility] OR;  Service: Orthopedics;  Laterality: Left;     Family History:  The patient's family history includes Autism in her son and son; Cancer in her maternal grandmother and mother; Colon cancer in her maternal grandmother; Colon polyps in her mother; Diabetes in her father; Down syndrome in her brother; GER disease in her brother; Healthy in her brother; Heart disease in her father; Hyperlipidemia in her mother; Hypertension in her mother; Urolithiasis in her sister.    ROS:  Please see the history of present illness. All other systems are reviewed and  Negative to the above problem except as noted.    PHYSICAL EXAM: VS:  BP 128/78   Pulse 83   Ht 5' 2 (1.575 m)   Wt 153 lb (69.4 kg)   LMP 07/20/2006   SpO2 96%   BMI 27.98 kg/m   GEN: Well nourished, well developed, in no acute distress  HEENT: normal  Neck: no JVD, carotid bruits, Cardiac: RRR; I/VI systolic murmur base  No diastolic murmurs Respiratory:  clear to auscultation  GI: soft, nontender,  No hepatomegaly  Ext  No LE edema  2+ PT  pulses    EKG:  EKG is ordered today.  NSR 83 bpm  Nonspecific ST changes  Echo  Dec 2025  1. Left ventricular ejection fraction, by estimation, is 60 to 65%. The  left ventricle has normal function. The left ventricle has no regional  wall motion abnormalities. Left ventricular diastolic parameters are  consistent with Grade II diastolic  dysfunction (pseudonormalization).   2. Right ventricular systolic function is normal. The right ventricular  size is normal.   3. The mitral valve is normal in structure. No evidence of mitral valve  regurgitation. No evidence of mitral stenosis.   4. Suspect at least functionally bicuspid valve based on appearance and  AR jet. Moderate-severe, has progressed since previous study. The aortic  valve is tricuspid. There is mild calcification of the aortic valve.  Aortic valve regurgitation is moderate  to severe. Aortic valve sclerosis is  present, with no evidence of aortic  valve stenosis. Aortic regurgitation PHT measures 397 msec.   5. The inferior vena cava is normal in size with greater than 50%  respiratory variability, suggesting right atrial pressure of 3 mmHg.   TTE 02/2022: IMPRESSIONS   1. Left ventricular ejection fraction, by estimation, is 60 to 65%. The  left ventricle has normal function. The left ventricle has no regional  wall motion abnormalities. Left ventricular diastolic parameters were  normal.   2. Right ventricular systolic function is normal. The right ventricular  size is normal. Tricuspid regurgitation signal is inadequate for assessing  PA pressure.   3. The mitral valve is normal in structure. Trivial mitral valve  regurgitation. No evidence of mitral stenosis.   4. The aortic valve is calcified. Aortic valve regurgitation is mild to  moderate. Aortic valve sclerosis is present, with no evidence of aortic  valve stenosis. Aortic regurgitation PHT measures 453 msec.   5. The inferior vena cava is normal in size with greater than 50%  respiratory variability, suggesting right atrial pressure of 3 mmHg.    Cardiac Monitor 01/2022:   Patch wear time was 6 days and 23 hours   Predominant rhythm was NSR with average HR 85bpm   There were 5 nonsustained runs of SVT with longest lasting 9.9seconds   Rare SVE (<1%), rare VE (<1%)   Patient triggered events correlate with PVCs   No sustained arrhythmias or significant pauses  Lipid Panel    Component Value Date/Time   CHOL 296 (H) 11/11/2016 1537   TRIG 139 11/11/2016 1537   HDL 73 11/11/2016 1537   CHOLHDL 4.1 11/11/2016 1537   VLDL 28 11/11/2016 1537   LDLCALC 195 (H) 11/11/2016 1537      Wt Readings from Last 3 Encounters:  07/25/24 153 lb (69.4 kg)  01/15/23 151 lb 7.3 oz (68.7 kg)  09/04/22 151 lb 6.4 oz (68.7 kg)      ASSESSMENT AND PLAN:  1  Palpitations   The pt has not had any documented SVT beyond very brief episodes   Longest was before EMS got her several years  ago Follow     AVoid excess stimulants  Stay hydrated  I have recomm that she get a Kardia mobile   I am not convinced the palpitations she is sensing represent SVT (given monitor findings)  2  Aortic insufficiency   Pt with recent echo that shows normal LV size and function AI appears moderate     I would continue to follow   Repeat echo in 1 year  3  HL   LDL 187 in 2024  Will repeat, check Lpa and Apo B    Aso check A1C      Not on Rx  4  Preop cardiac risk assessment   Pt plans to have plate removed from leg soon   From a cardiac standpoint I think she is a low risk for a major event   OK to proceed   If she has any arrhythmias during procedure (SVT ) they can be treated at that time   WIll be in touch with pt re test results  / recommendations Tentative follow up in 1 year with echo prior   Signed, Vina Gull, MD        [1]  Current Meds  Medication Sig   apixaban  (ELIQUIS ) 2.5 MG TABS tablet Take 1 tablet (2.5 mg total) by mouth 2 (two) times daily.   b complex vitamins tablet Take 1 tablet by mouth as needed. Reported on 09/02/2015   calcium  carbonate (TUMS - DOSED IN MG ELEMENTAL CALCIUM ) 500 MG chewable tablet Chew 1 tablet by mouth as needed for indigestion or heartburn.   Cholecalciferol  (VITAMIN D ) 1000 UNITS capsule Take 1,000 Units by mouth daily. Reported on 09/02/2015   Coenzyme Q10 (CO Q 10 PO) Take 50 mg by mouth daily. Reported on 09/02/2015   docusate sodium  (COLACE) 100 MG capsule Take 1 capsule (100 mg total) by mouth 2 (two) times daily.   Magnesium 200 MG TABS Take 200 mg by mouth 2 (two) times daily. Per patient taking 400 mg daily   melatonin 1 MG TABS tablet Take 1 mg by mouth at bedtime.   methocarbamol  (ROBAXIN ) 500 MG tablet Take 1 tablet (500 mg total) by mouth every 8 (eight) hours as needed for muscle spasms.   NASAL SALINE NA Place into the nose daily.   OMEGA 3-6-9 FATTY ACIDS PO Take 1 capsule by  mouth 2 (two) times daily.   psyllium (METAMUCIL) 58.6 % powder Take 2 packets by mouth daily.   vitamin C (ASCORBIC ACID) 500 MG tablet Take 500 mg by mouth daily.   "

## 2024-07-25 ENCOUNTER — Ambulatory Visit: Attending: Internal Medicine | Admitting: Internal Medicine

## 2024-07-25 ENCOUNTER — Encounter: Payer: Self-pay | Admitting: Internal Medicine

## 2024-07-25 VITALS — BP 128/78 | HR 83 | Ht 62.0 in | Wt 153.0 lb

## 2024-07-25 DIAGNOSIS — Z8249 Family history of ischemic heart disease and other diseases of the circulatory system: Secondary | ICD-10-CM | POA: Diagnosis not present

## 2024-07-25 DIAGNOSIS — I471 Supraventricular tachycardia, unspecified: Secondary | ICD-10-CM

## 2024-07-25 DIAGNOSIS — I351 Nonrheumatic aortic (valve) insufficiency: Secondary | ICD-10-CM

## 2024-07-25 DIAGNOSIS — I359 Nonrheumatic aortic valve disorder, unspecified: Secondary | ICD-10-CM

## 2024-07-25 NOTE — Patient Instructions (Signed)
" ° °  Testing/Procedures:  Your physician has requested that you have an echocardiogram. Echocardiography is a painless test that uses sound waves to create images of your heart. It provides your doctor with information about the size and shape of your heart and how well your hearts chambers and valves are working. This procedure takes approximately one hour. There are no restrictions for this procedure. Please do NOT wear cologne, perfume, aftershave, or lotions (deodorant is allowed). Please arrive 15 minutes prior to your appointment time.  Please note: We ask at that you not bring children with you during ultrasound (echo/ vascular) testing. Due to room size and safety concerns, children are not allowed in the ultrasound rooms during exams. Our front office staff cannot provide observation of children in our lobby area while testing is being conducted. An adult accompanying a patient to their appointment will only be allowed in the ultrasound room at the discretion of the ultrasound technician under special circumstances. We apologize for any inconvenience. [address]-SCHEDULE IN 1 YEAR  Follow-Up: At [facility], you and your health needs are our priority.  As part of our continuing mission to provide you with exceptional heart care, our providers are all part of one team.  This team includes your primary Cardiologist (physician) and Advanced Practice Providers or APPs (Physician Assistants and Nurse Practitioners) who all work together to provide you with the care you need, when you need it.  Your next appointment:   12 month(s)  Provider:   VINA GULL MD            "

## 2024-07-26 ENCOUNTER — Ambulatory Visit: Payer: Self-pay | Admitting: Internal Medicine

## 2024-07-26 DIAGNOSIS — E7800 Pure hypercholesterolemia, unspecified: Secondary | ICD-10-CM

## 2024-07-26 DIAGNOSIS — E7841 Elevated Lipoprotein(a): Secondary | ICD-10-CM

## 2024-07-26 LAB — HEMOGLOBIN A1C
Est. average glucose Bld gHb Est-mCnc: 117 mg/dL
Hgb A1c MFr Bld: 5.7 % — ABNORMAL HIGH (ref 4.8–5.6)

## 2024-07-26 LAB — LIPID PANEL
Chol/HDL Ratio: 3.9 ratio (ref 0.0–4.4)
Cholesterol, Total: 320 mg/dL — ABNORMAL HIGH (ref 100–199)
HDL: 82 mg/dL
LDL Chol Calc (NIH): 210 mg/dL — ABNORMAL HIGH (ref 0–99)
Triglycerides: 152 mg/dL — ABNORMAL HIGH (ref 0–149)
VLDL Cholesterol Cal: 28 mg/dL (ref 5–40)

## 2024-07-26 LAB — LIPOPROTEIN A (LPA): Lipoprotein (a): 256.3 nmol/L — AB

## 2024-07-26 LAB — APOLIPOPROTEIN B: Apolipoprotein B: 170 mg/dL — AB

## 2024-07-27 ENCOUNTER — Encounter: Payer: Self-pay | Admitting: Internal Medicine

## 2024-08-04 NOTE — Telephone Encounter (Signed)
 Repeat labs in March   Fasting    Add A1C to labs if not ordered as well

## 2024-08-11 ENCOUNTER — Other Ambulatory Visit: Payer: Self-pay

## 2024-08-11 ENCOUNTER — Encounter (HOSPITAL_COMMUNITY): Payer: Self-pay | Admitting: Orthopedic Surgery

## 2024-08-11 NOTE — Anesthesia Preprocedure Evaluation (Signed)
"                                    Anesthesia Evaluation    Airway        Dental   Pulmonary           Cardiovascular   Echo 06/27/2024: IMPRESSIONS  1. Left ventricular ejection fraction, by estimation, is 60 to 65%. The  left ventricle has normal function. The left ventricle has no regional  wall motion abnormalities. Left ventricular diastolic parameters are  consistent with Grade II diastolic  dysfunction (pseudonormalization).  2. Right ventricular systolic function is normal. The right ventricular  size is normal.  3. The mitral valve is normal in structure. No evidence of mitral valve  regurgitation. No evidence of mitral stenosis.  4. Suspect at least functionally bicuspid valve based on appearance and  AR jet. Moderate-severe, has progressed since previous study. The aortic  valve is tricuspid. There is mild calcification of the aortic valve.  Aortic valve regurgitation is moderate  to severe. Aortic valve sclerosis is present, with no evidence of aortic  valve stenosis. Aortic regurgitation PHT measures 397 msec.  5. The inferior vena cava is normal in size with greater than 50%  respiratory variability, suggesting right atrial pressure of 3 mmHg.      Neuro/Psych    GI/Hepatic   Endo/Other    Renal/GU      Musculoskeletal   Abdominal   Peds  Hematology   Anesthesia Other Findings   Reproductive/Obstetrics                              Anesthesia Physical Anesthesia Plan  ASA:   Anesthesia Plan:    Post-op Pain Management:    Induction:   PONV Risk Score and Plan:   Airway Management Planned:   Additional Equipment:   Intra-op Plan:   Post-operative Plan:   Informed Consent:   Plan Discussed with:   Anesthesia Plan Comments: (PAT note written 08/11/2024 by Levon Boettcher, PA-C.  )        Anesthesia Quick Evaluation  "

## 2024-08-11 NOTE — Progress Notes (Signed)
 SDW call  Patient was given pre-op instructions over the phone. Patient verbalized understanding of instructions provided.  Denied any SOB, fever or cough   PCP - Dr. Vyvyan Sun Cardiologist -  Dr. Vina Gull Pulmonary:    PPM/ICD - denies Device Orders - na Rep Notified - na   Chest x-ray - 01/26/2022 EKG -  07/25/2024 Stress Test - ECHO - 06/27/2024 Cardiac Cath -   Sleep Study/sleep apnea/CPAP: denies  Non-diabetic   Blood Thinner Instructions: denies Aspirin Instructions:denies   ERAS Protcol - Clears until 0500  Anesthesia review: Yes. Cardiac hx  Your procedure is scheduled on Tuesday August 15, 2024  Report to [facility] Main Entrance A at  0530  A.M., then check in with the Admitting office.  Call this number if you have problems the morning of surgery:  [phone]   If you have any questions prior to your surgery date call [phone]: Open Monday-Friday 8am-4pm If you experience any cold or flu symptoms such as cough, fever, chills, shortness of breath, etc. between now and your scheduled surgery, please notify us  at the above number    Remember:  Do not eat after midnight the night before your surgery  You may drink clear liquids until   0500  the morning of your surgery.   Clear liquids allowed are: Water, Non-Citrus Juices (without pulp), Carbonated Beverages, Clear Tea, Black Coffee ONLY (NO MILK, CREAM OR POWDERED CREAMER of any kind), and Gatorade   Take these medicines the morning of surgery with A SIP OF WATER:  None As of today, STOP taking any Aspirin (unless otherwise instructed by your surgeon) Aleve, Naproxen, Ibuprofen, Motrin, Advil, Goody's, BC's, all herbal medications, fish oil, and all vitamins.

## 2024-08-11 NOTE — Progress Notes (Signed)
 Anesthesia Chart Review: Margaret Day  Case: [id number] Date/Time: 08/15/24 0745   Procedure: REMOVAL, HARDWARE (Left)   Anesthesia type: Choice   Diagnosis: Pain due to bone fixation device, initial encounter [T84.84XA]   Pre-op diagnosis: SYMPTOMATIC HARDWARE LEFT TIBIAL PLATEAU   [facility] ROOM 03 / [facility] OR   Surgeons: Celena Sharper, MD       DISCUSSION: Patient is a 69 year old female scheduled for the above procedure.   History includes never smoker, HLD, palpitations/SVT, aortic regurgitation (moderate-severe AR 06/2024), varicose veins, left tibial plateau fracture (s/p ORIF 01/18/2023).   He had cardiology visit with Dr. Vina Gull on 1 01/12/2025 for follow-up palpitations and HLD. Per Notes: -June 2023  Seen in [facility] for dizziness and heart racing  Per patinet lasted awhile  In EMS had intermitt SVT   In ER was SR   -July 2023  Echo  LVEF 60 to 65%  Normal RVEF   Mild to moderate AI  Recomm follow up in 2 years -July 2023  Monitor showed 5 brief runs of SVT  Longest lasted 9.9 seconds   Triggered events correlated with SR, SR with PAC, SR with PVC  PACS and PVCs were rare  Pt tried on metoprolol   DId not tolerate due to fatigue     Dec 2025  The pt hd an echo done   This showed Normal LVEF and RVEF  AI appeared moderate on my review At last visit, she noted some heart fluttering (mostly with mental stress and not prolonged). No chest pain, dizziness, syncope. Breathing was okay. Advised to avoid excess stimulants and stay hydrated.  Encouraged Kardia mobile. In regards to surgery, Dr. Gull wrote, Preop cardiac risk assessment Pt plans to have plate removed from leg soon From a cardiac standpoint I think she is a low risk for a major event OK to proceed If she has any arrhythmias during procedure (SVT ) they can be treated at that time. Plan for one year follow-up with repeat echo prior to visit to re-evaluate AR.  She did order A1c, Lipid panel, Lipoprotein which were done on  08/04/2024. A1c was 5.7% on 07/25/2024.  Lipid panel in lipoprotein a results elevated on 07/25/2024. Dr. Gull advised starting Crestor 20 mg daily and repeat in 8 week.   She has a same-day workup so updated labs on arrival as indicated.     VS: LMP 07/20/2006  Wt Readings from Last 3 Encounters:  07/25/24 69.4 kg  01/15/23 68.7 kg  09/04/22 68.7 kg   BP Readings from Last 3 Encounters:  07/25/24 128/78  01/20/23 (!) 141/71  01/27/22 110/70   Pulse Readings from Last 3 Encounters:  07/25/24 83  01/20/23 93  09/04/22 92     PROVIDERS: Sun, Vyvyan, MD is PCP    LABS: For day of surgery as indicated.    EKG: 07/25/2024: Normal sinus rhythm Nonspecific T wave abnormality When compared with ECG of 27-Jan-2022 00:57, PREVIOUS ECG IS PRESENT Confirmed by Gull Vina ([id number]) on 07/25/2024 8:16:19 AM    CV: Echo 06/27/2024: IMPRESSIONS   1. Left ventricular ejection fraction, by estimation, is 60 to 65%. The  left ventricle has normal function. The left ventricle has no regional  wall motion abnormalities. Left ventricular diastolic parameters are  consistent with Grade II diastolic  dysfunction (pseudonormalization).   2. Right ventricular systolic function is normal. The right ventricular  size is normal.   3. The mitral valve is normal in structure. No  evidence of mitral valve  regurgitation. No evidence of mitral stenosis.   4. Suspect at least functionally bicuspid valve based on appearance and  AR jet. Moderate-severe, has progressed since previous study. The aortic  valve is tricuspid. There is mild calcification of the aortic valve.  Aortic valve regurgitation is moderate  to severe. Aortic valve sclerosis is present, with no evidence of aortic  valve stenosis. Aortic regurgitation PHT measures 397 msec.   5. The inferior vena cava is normal in size with greater than 50%  respiratory variability, suggesting right atrial pressure of 3 mmHg.    Long term monitor 01/29/2022  - 02/05/2022:   Patch wear time was 6 days and 23 hours   Predominant rhythm was NSR with average HR 85bpm   There were 5 nonsustained runs of SVT with longest lasting 9.9seconds   Rare SVE (<1%), rare VE (<1%)   Patient triggered events correlate with PVCs   No sustained arrhythmias or significant pauses    Past Medical History:  Diagnosis Date   Allergy    seasonal   Closed bicondylar fracture of left tibial plateau 01/15/2023   Hyperlipidemia    Palpitations    h/o, probably benign   Reflux    normal EGD 2009   SVT (supraventricular tachycardia)     Past Surgical History:  Procedure Laterality Date   CESAREAN SECTION  1992   COLONOSCOPY  06/2005   Dr. Nolon; 11/2014 Dr. Kristie   ESOPHAGOGASTRODUODENOSCOPY  2009   unremarkable   ORIF TIBIA PLATEAU Left 01/18/2023   Procedure: OPEN REDUCTION INTERNAL FIXATION (ORIF) TIBIAL PLATEAU;  Surgeon: Celena Sharper, MD;  Location: [facility] OR;  Service: Orthopedics;  Laterality: Left;    MEDICATIONS:  b complex vitamins tablet   calcium  elemental as carbonate (BARIATRIC TUMS ULTRA) 400 MG chewable tablet   Cholecalciferol  (VITAMIN D ) 1000 UNITS capsule   Gamma-Aminobutyric Acid (GABA PO)   Magnesium 100 MG TABS   melatonin 1 MG TABS tablet   NASAL SALINE NA   OVER THE COUNTER MEDICATION   PSYLLIUM PO   UBIQUINOL PO   vitamin C (ASCORBIC ACID) 500 MG tablet    Isaiah Ruder, PA-C Surgical Short Stay/Anesthesiology [facility] Phone [phone] [facility] Phone [phone] 08/11/2024 10:32 AM

## 2024-08-15 ENCOUNTER — Ambulatory Visit (HOSPITAL_COMMUNITY): Admission: RE | Admit: 2024-08-15 | Source: Home / Self Care | Admitting: Orthopedic Surgery

## 2024-08-15 ENCOUNTER — Encounter (HOSPITAL_COMMUNITY): Payer: Self-pay | Admitting: Vascular Surgery

## 2024-08-15 ENCOUNTER — Encounter (HOSPITAL_COMMUNITY): Admission: RE | Payer: Self-pay | Source: Home / Self Care

## 2024-08-15 HISTORY — DX: Nonrheumatic aortic (valve) insufficiency: I35.1
